# Patient Record
Sex: Male | Born: 1995
Health system: Southern US, Community
[De-identification: ages and names within clinical notes are randomized; demographics above are authoritative.]

---
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
## 2019-03-14 ENCOUNTER — Emergency Department: Payer: Self-pay

## 2019-03-14 ENCOUNTER — Other Ambulatory Visit: Payer: Self-pay

## 2019-03-14 ENCOUNTER — Emergency Department
Admission: EM | Admit: 2019-03-14 | Discharge: 2019-03-14 | Disposition: A | Discharge status: Home / Self Care | Payer: Self-pay | Attending: Student in an Organized Health Care Education/Training Program | Admitting: Student in an Organized Health Care Education/Training Program

## 2019-03-14 DIAGNOSIS — M62838 Other muscle spasm: Secondary | ICD-10-CM | POA: Insufficient documentation

## 2019-03-14 DIAGNOSIS — M25511 Pain in right shoulder: Secondary | ICD-10-CM | POA: Diagnosis present

## 2019-03-14 MED ORDER — NAPROXEN 500 MG PO TABS
500.0000 mg | ORAL_TABLET | Freq: Once | ORAL | Status: AC
  Administered 2019-03-14: 21:00:00 500 mg via ORAL
  Filled 2019-03-14: qty 1

## 2019-03-14 MED ORDER — CYCLOBENZAPRINE HCL 10 MG PO TABS
10.0000 mg | ORAL_TABLET | Freq: Once | ORAL | Status: AC
  Administered 2019-03-14: 10 mg via ORAL
  Filled 2019-03-14: qty 1

## 2019-03-14 MED ORDER — IBUPROFEN 600 MG PO TABS: 600.0000 mg | ORAL_TABLET | Freq: Four times a day (QID) | ORAL | 0 refills | Status: AC | PRN

## 2019-03-14 MED ORDER — CYCLOBENZAPRINE HCL 10 MG PO TABS: 10.0000 mg | ORAL_TABLET | Freq: Three times a day (TID) | ORAL | 0 refills | Status: AC | PRN

## 2019-03-14 NOTE — ED Triage Notes (Signed)
Pt states he sneezed around 1600 today, resulting in right shoulder pain and swelling. Pt is able to aduct and abduct without difficulty.

## 2019-03-14 NOTE — ED Notes (Signed)
ED Provider at bedside. 

## 2019-03-14 NOTE — ED Provider Notes (Signed)
[facility] Emergency Department Provider Note ____________________________________________  Time seen: Approximately 8:36 PM  I have reviewed the triage vital signs and the nursing notes.   HISTORY  Chief Complaint Shoulder Pain    HPI [name] is a 23 y.o. male who presents to the emergency department for evaluation and treatment of right shoulder pain.  Patient sneezed approximately 4 PM this afternoon and subsequently had immediate and intense right shoulder pain.  Mother states that she looked at his back and shoulder and said that it looked "swollen."  She applied icy hot.  Pain continues.  No past medical history on file.  There are no active problems to display for this patient.  Prior to Admission medications   Medication Sig Start Date End Date Taking? Authorizing Provider  cyclobenzaprine (FLEXERIL) 10 MG tablet Take 1 tablet (10 mg total) by mouth 3 (three) times daily as needed. 03/14/19   Eduardo Tesfaye B, FNP  ibuprofen (ADVIL) 600 MG tablet Take 1 tablet (600 mg total) by mouth every 6 (six) hours as needed. 03/14/19   Eduardo Pester, FNP    Allergies Patient has no known allergies.  No family history on file.  Social History Social History   Tobacco Use  . Smoking status: Not on file  Substance Use Topics  . Alcohol use: Not on file  . Drug use: Not on file    Review of Systems Constitutional: Negative for fever. Cardiovascular: Negative for chest pain. Respiratory: Negative for shortness of breath. Musculoskeletal: Positive for left shoulder pain. Skin: Negative for open wounds or lesions Neurological: Negative for decrease in sensation  ____________________________________________   PHYSICAL EXAM:  VITAL SIGNS: ED Triage Vitals [03/14/19 2011]  Enc Vitals Group     BP (!) 144/73     Pulse Rate 64     Resp 14     Temp 98.1 F (36.7 C)     Temp Source Oral     SpO2 98 %     Weight 228 lb (103.4 kg)   Height 5\' 10"  (1.778 m)     Head Circumference      Peak Flow      Pain Score 8     Pain Loc      Pain Edu?      Excl. in GC?     Constitutional: Alert and oriented. Well appearing and in no acute distress. Eyes: Conjunctivae are clear without discharge or drainage Head: Atraumatic Neck: Supple.  No focal midline tenderness. Respiratory: No cough. Respirations are even and unlabored. Musculoskeletal: Full range of motion of the right shoulder.  Superior aspect of the right shoulder is tender upon palpation.  No swelling Neurologic: Motor and sensory function is intact Skin: Skin is intact Psychiatric: Affect and behavior are appropriate.  ____________________________________________   LABS (all labs ordered are listed, but only abnormal results are displayed)  Labs Reviewed - No data to display ____________________________________________  RADIOLOGY  Images with shoulder ____________________________________________   PROCEDURES  Procedures  ____________________________________________   INITIAL IMPRESSION / ASSESSMENT AND PLAN / ED COURSE  [name] is a 23 y.o. who presents to the emergency department for treatment and evaluation of right shoulder pain after sneezing at approximately 4:00 this afternoon.  See HPI for further details.  Patient will be treated with 5 mg of Flexeril and 500 mg of Naprosyn.  Prescriptions for the same will be submitted to his pharmacy.  Patient instructed to follow-up with orthopedics for symptoms that are not  improving with time, rest and medication.  He was also instructed to return to the emergency department for symptoms that change or worsen if unable schedule an appointment with orthopedics or primary care.  Medications  cyclobenzaprine (FLEXERIL) tablet 10 mg (10 mg Oral Given 03/14/19 2113)  naproxen (NAPROSYN) tablet 500 mg (500 mg Oral Given 03/14/19 2114)    Pertinent labs & imaging results that were available during my  care of the patient were reviewed by me and considered in my medical decision making (see chart for details).  _________________________________________   FINAL CLINICAL IMPRESSION(S) / ED DIAGNOSES  Final diagnoses:  Trapezius muscle spasm    ED Discharge Orders         Ordered    cyclobenzaprine (FLEXERIL) 10 MG tablet  3 times daily PRN     03/14/19 2105    ibuprofen (ADVIL) 600 MG tablet  Every 6 hours PRN     03/14/19 2105           If controlled substance prescribed during this visit, 12 month history viewed on the [facility] prior to issuing an initial prescription for Schedule II or III opiod.   Eduardo Dike, FNP 03/14/19 2154    Eduardo Lot, MD 03/14/19 2202

## 2019-08-27 ENCOUNTER — Ambulatory Visit: Payer: Self-pay

## 2020-10-03 IMAGING — CR DG SHOULDER 2+V*R*
3 series · 3 of 3 positions shown · non-contrast
Comparison: None.

CLINICAL DATA: Pain

EXAM:
RIGHT SHOULDER - 2+ VIEW

[shoulder grashey]
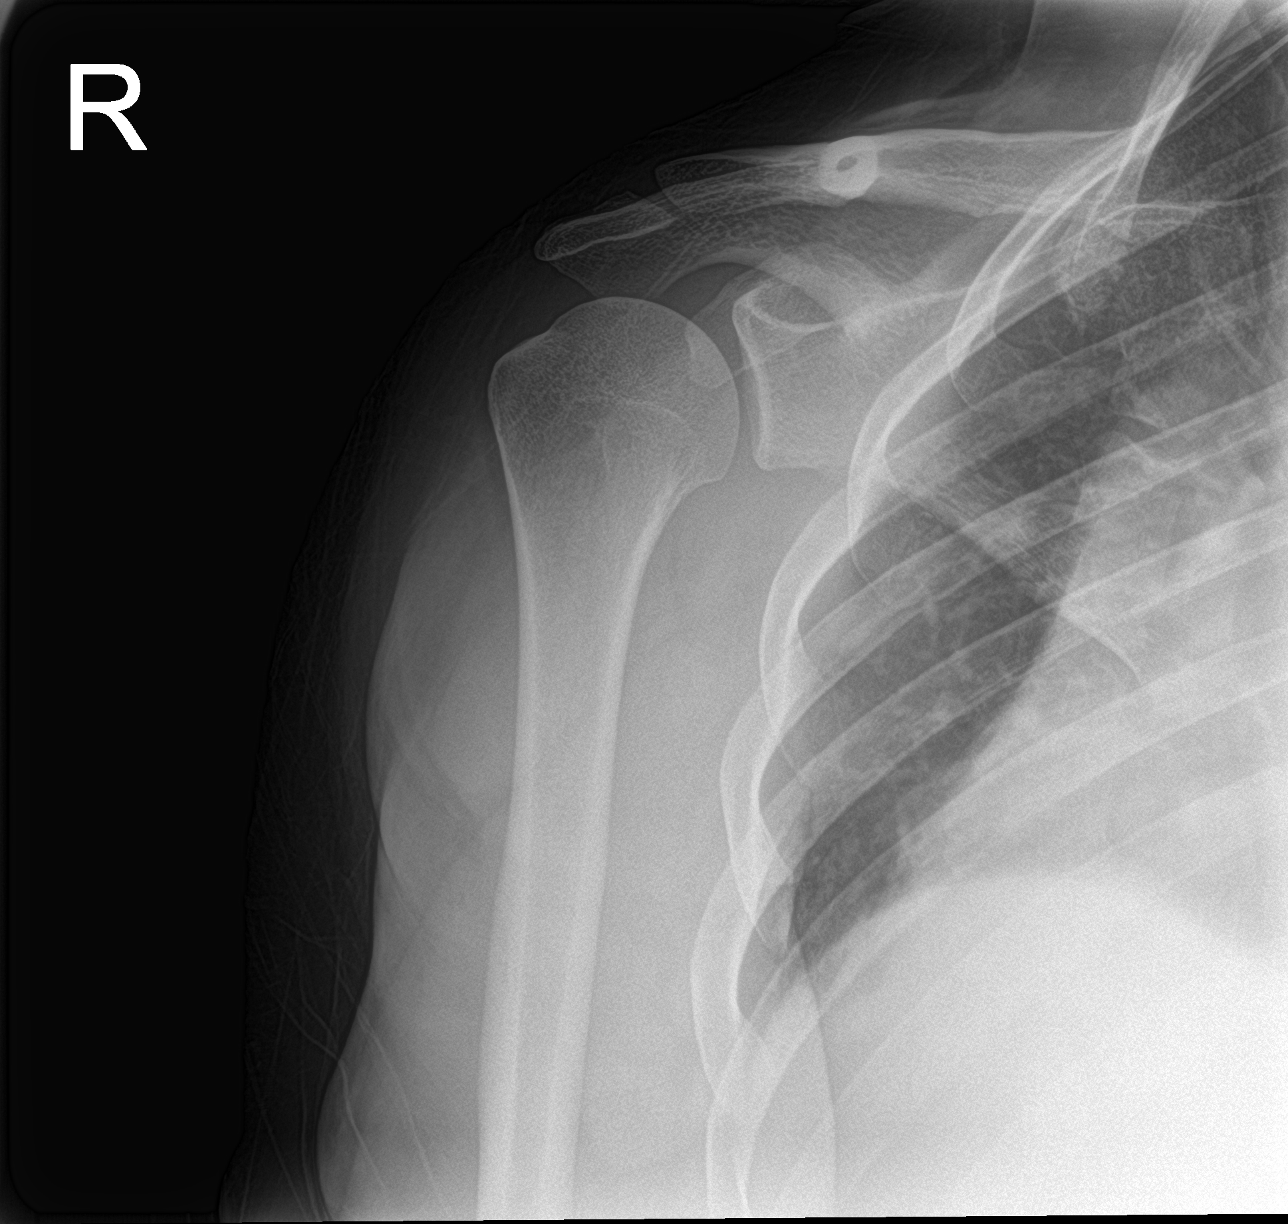

[shoulder y view]
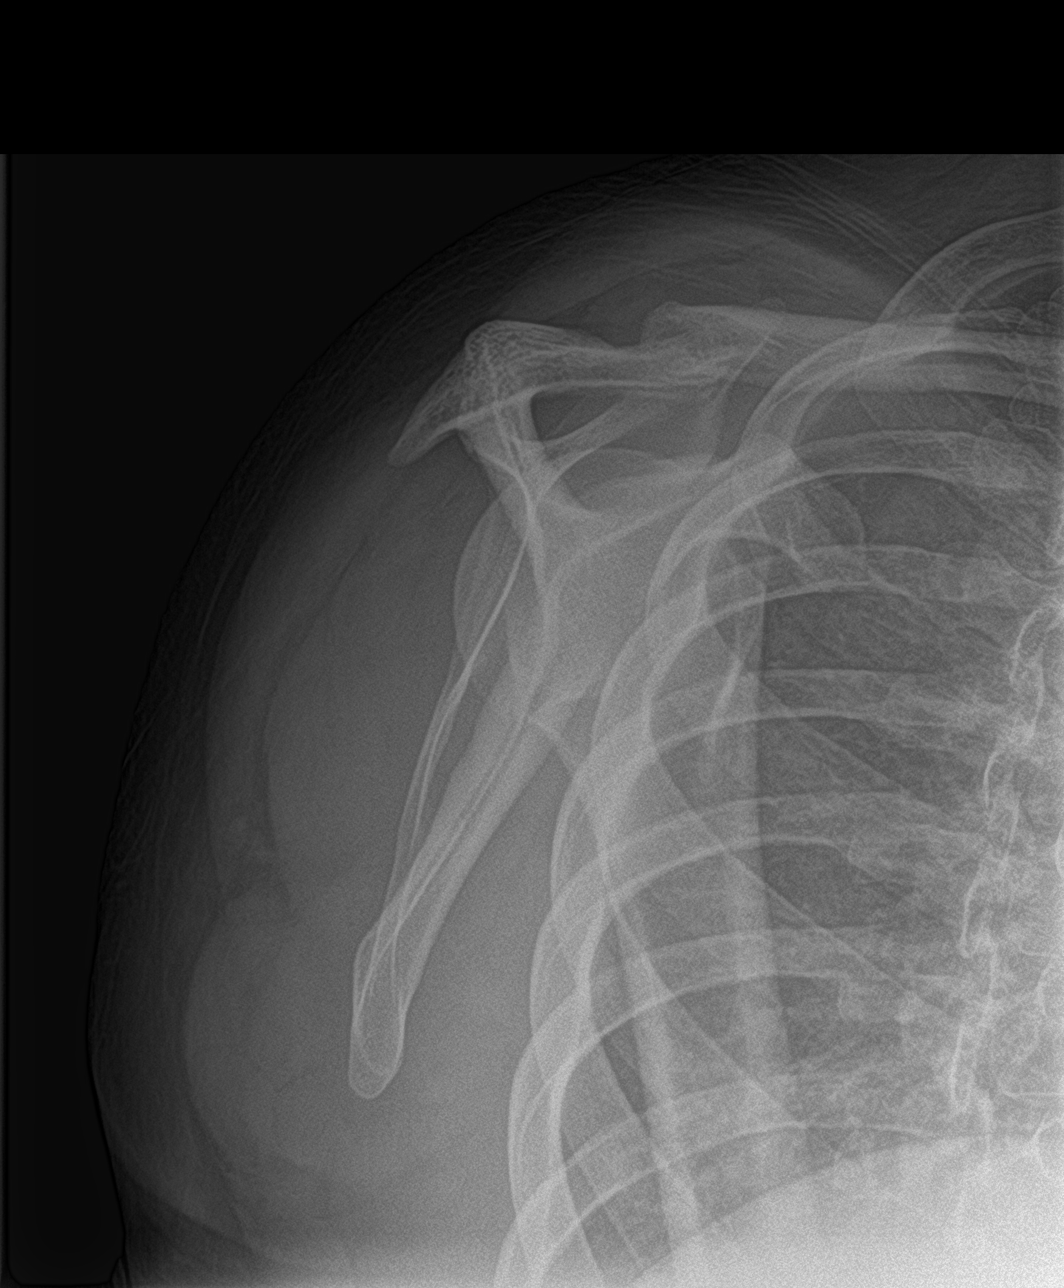

[shoulder ap neutral]
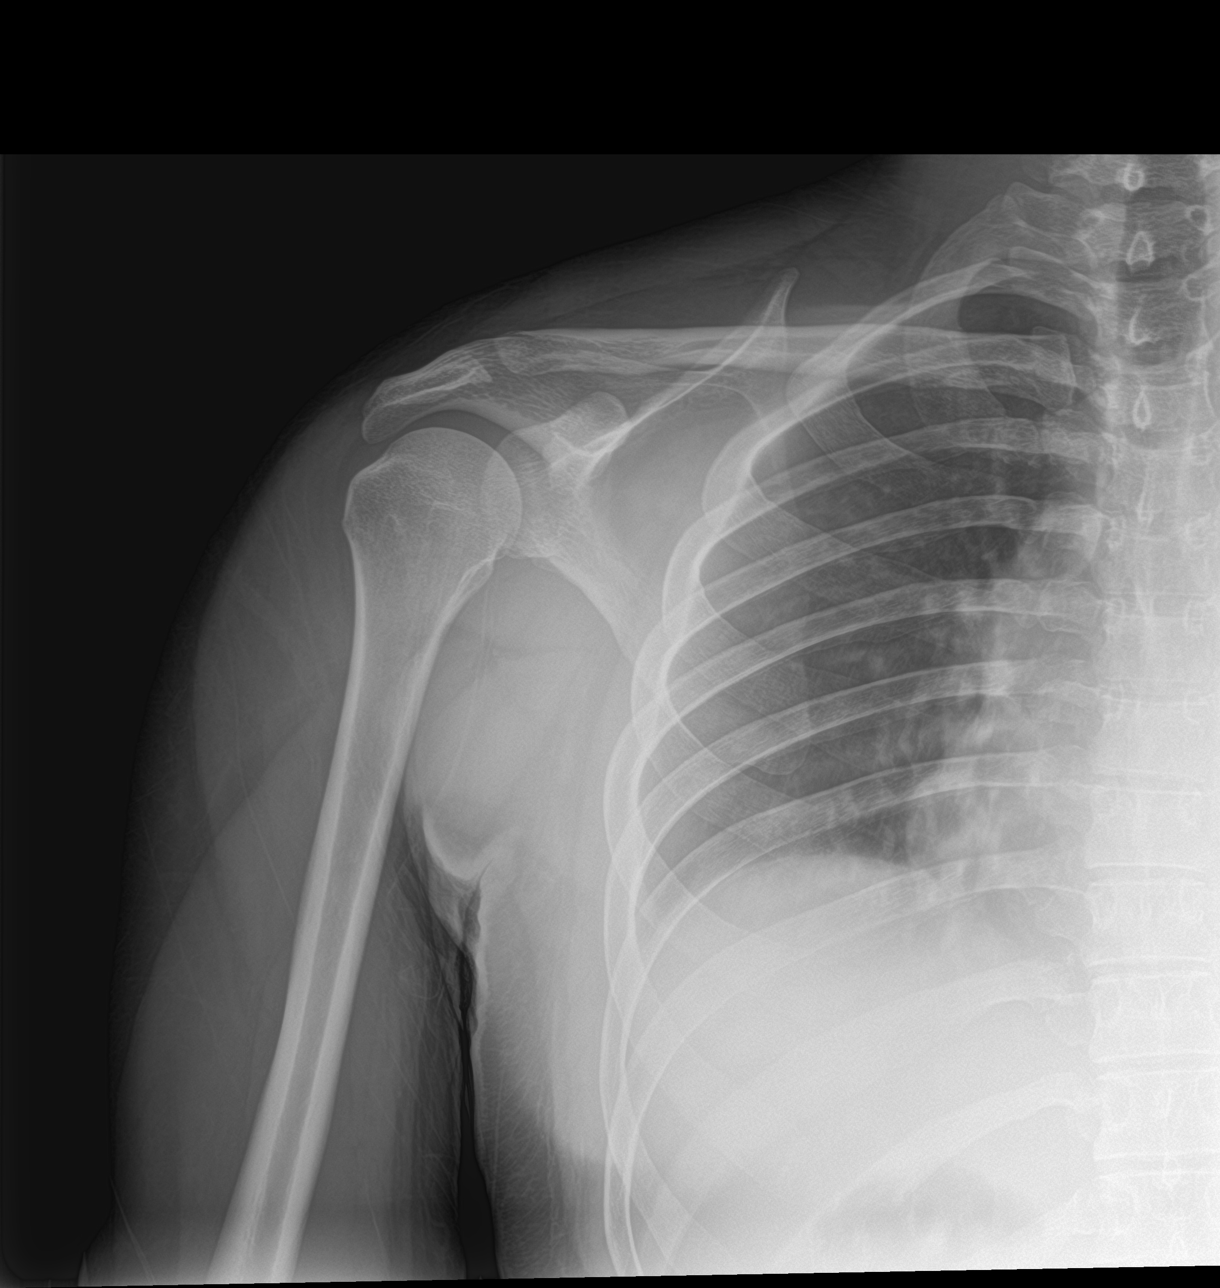

[3 of 3 positions shown; findings below may reference images not displayed]

FINDINGS: There is no evidence of fracture or dislocation. There is no
evidence of arthropathy or other focal bone abnormality. Soft
tissues are unremarkable.
IMPRESSION: Negative.

## 2022-07-16 ENCOUNTER — Encounter: Payer: Worker's Compensation | Attending: Physician Assistant | Admitting: Physician Assistant

## 2022-07-16 DIAGNOSIS — T25322A Burn of third degree of left foot, initial encounter: Secondary | ICD-10-CM | POA: Diagnosis present

## 2022-07-16 DIAGNOSIS — W3182XA Contact with other commercial machinery, initial encounter: Secondary | ICD-10-CM | POA: Diagnosis not present

## 2022-07-16 DIAGNOSIS — L97522 Non-pressure chronic ulcer of other part of left foot with fat layer exposed: Secondary | ICD-10-CM | POA: Diagnosis not present

## 2022-07-18 NOTE — Progress Notes (Signed)
[name] ([id number]) [phone] [id number] Page 1 of 4 Visit Report for 07/16/2022 Abuse Risk Screen Details Patient Name: Date of Service: [name] 07/16/2022 9:30 A M Medical Record Number: [id number] Patient Account Number: [id number] Date of Birth/Sex: Treating RN: Jul 24, 1995 (27 y.o. [name]) [name] Primary Care Aamirah Salmi: PA [name], NO Other Clinician: Referring Dartagnan Beavers: Treating Adolphe Fortunato/Extender: [name] in Treatment: 0 Abuse Risk Screen Items Answer ABUSE RISK SCREEN: Has anyone close to you tried to hurt or harm you recentlyo No Do you feel uncomfortable with anyone in your familyo No Has anyone forced you do things that you didnt want to doo No Electronic Signature(s) Signed: 07/18/2022 4:28:11 PM By: [name] RN Entered By: [name] on 07/16/2022 10:12:38 -------------------------------------------------------------------------------- Activities of Daily Living Details Patient Name: Date of Service: DASH, THORELL 07/16/2022 9:30 A M Medical Record Number: [id number] Patient Account Number: [id number] Date of Birth/Sex: Treating RN: 1996-03-27 (27 y.o. [name]) [name] Primary Care Lyne Khurana: PA [name], NO Other Clinician: Referring Ellenor Wisniewski: Treating Taelor Moncada/Extender: [name] in Treatment: 0 Activities of Daily Living Items Answer Activities of Daily Living (Please select one for each item) Drive Automobile Completely Able T Medications ake Completely Able Use T elephone Completely Able Care for Appearance Completely Able Use T oilet Completely Able Bath / Shower Completely Able Dress Self Completely Able Feed Self Completely Able Walk Completely Able Get In / Out Bed Completely Able Housework Completely Able Prepare Meals Completely Able Handle Money Completely Able Shop for Self Completely Able Electronic Signature(s) Signed: 07/18/2022 4:28:11 PM By: [name]  RN Entered By: [name] on 07/16/2022 10:13:00 -------------------------------------------------------------------------------- Education Screening Details Patient Name: Date of Service: [name] NU 07/16/2022 9:30 A M Medical Record Number: [id number] Patient Account Number: [id number] Date of Birth/Sex: Treating RN: 1995-05-21 (27 y.o. [name] Primary Care Adelin Ventrella: PA [name], NO Other Clinician: Referring Anijah Spohr: Treating Alanna Storti/Extender: [name] in Treatment: 0 [name] ([id number]) [phone] [id number] Page 2 of 4 Primary Learner Assessed: Patient Learning Preferences/Education Level/Primary Language Learning Preference: Explanation Highest Education Level: College or Above Preferred Language: English Cognitive Barrier Language Barrier: No Translator Needed: No Memory Deficit: No Emotional Barrier: No Cultural/Religious Beliefs Affecting Medical Care: No Physical Barrier Impaired Vision: No Impaired Hearing: No Decreased Hand dexterity: No Knowledge/Comprehension Knowledge Level: Medium Comprehension Level: High Ability to understand written instructions: High Ability to understand verbal instructions: High Motivation Anxiety Level: Anxious Cooperation: Cooperative Education Importance: Acknowledges Need Interest in Health Problems: Asks Questions Perception: Coherent Willingness to Engage in Self-Management High Activities: Readiness to Engage in Self-Management High Activities: Electronic Signature(s) Signed: 07/18/2022 4:28:11 PM By: [name] RN Entered By: [name] on 07/16/2022 10:13:30 -------------------------------------------------------------------------------- Fall Risk Assessment Details Patient Name: Date of Service: [name] NU 07/16/2022 9:30 A M Medical Record Number: [id number] Patient Account Number: [id number] Date of Birth/Sex: Treating RN: [birth year] (27 y.o. [name])  [name] Primary Care Finnigan Warriner: PA [name], NO Other Clinician: Referring Latonia Conrow: Treating Darleen Moffitt/Extender: [name] in Treatment: 0 Fall Risk Assessment Items Have you had 2 or more falls in the last 12 monthso 0 No Have you had any fall that resulted in injury in the last 12 monthso 0 No FALLS RISK SCREEN History of falling - immediate or within 3 months 0 No Secondary diagnosis (Do you have 2 or more medical diagnoseso) 0 No Ambulatory aid None/bed rest/wheelchair/nurse 0 No Crutches/cane/walker 0 No Furniture 0 No Intravenous therapy Access/Saline/Heparin [employer]  0 No Gait/Transferring Normal/ bed rest/ wheelchair 0 No Weak (short steps with or without shuffle, stooped but able to lift head while walking, may seek 0 No support from furniture) Impaired (short steps with shuffle, may have difficulty arising from chair, head down, impaired 0 No balance) Mental Status Oriented to own ability 0 No Overestimates or forgets limitations 0 No Risk Level: Low Risk Score: 0 [name] ([id number]) [id number] Page 3 of 4 Electronic Signature(s) -------------------------------------------------------------------------------- Foot Assessment Details Patient Name: Date of Service: [name] 07/16/2022 9:30 A M Medical Record Number: [id number] Patient Account Number: [id number] Date of Birth/Sex: Treating RN: 1995/09/20 (27 y.o. [name]) [name] Primary Care Dynastee Brummell: PA [name], NO Other Clinician: Referring Noemy Hallmon: Treating Nandan Willems/Extender: [name] in Treatment: 0 Foot Assessment Items Site Locations + = Sensation present, - = Sensation absent, C = Callus, U = Ulcer R = Redness, W = Warmth, M = Maceration, PU = Pre-ulcerative lesion F = Fissure, S = Swelling, D = Dryness Assessment Right: Left: Other Deformity: No No Prior Foot Ulcer: No No Prior Amputation: No No Charcot Joint: No  No Ambulatory Status: Ambulatory Without Help Gait: Steady Electronic Signature(s) Signed: 07/18/2022 4:28:11 PM By: [name] RN Entered By: [name] on 07/16/2022 10:21:04 -------------------------------------------------------------------------------- Nutrition Risk Screening Details Patient Name: Date of Service: HANNAH, [place] 07/16/2022 9:30 A M Medical Record Number: [id number] Patient Account Number: [id number] Date of Birth/Sex: Treating RN: 02-13-1996 (27 y.o. [name] Primary Care Braxton Vantrease: PA [name], NO Other Clinician: Referring Fonnie Crookshanks: Treating Delona Clasby/Extender: [name] in Treatment: 0 Height (in): 69 Weight (lbs): 250 Body Mass Index (BMI): 36.9 Fontes, Darrion ([id number]) [id number] Page 4 of 4 Nutrition Risk Screening Items Score Screening NUTRITION RISK SCREEN: I have an illness or condition that made me change the kind and/or amount of food I eat 0 No I eat fewer than two meals per day 0 No I eat few fruits and vegetables, or milk products 0 No I have three or more drinks of beer, liquor or wine almost every day 0 No I have tooth or mouth problems that make it hard for me to eat 0 No I don't always have enough money to buy the food I need 0 No I eat alone most of the time 0 No I take three or more different prescribed or over-the-counter drugs a day 0 No Without wanting to, I have lost or gained 10 pounds in the last six months 0 No I am not always physically able to shop, cook and/or feed myself 0 No Nutrition Protocols Good Risk Protocol 0 No interventions needed Moderate Risk Protocol High Risk Proctocol Risk Level: Good Risk Score: 0 Electronic Signature(s) Signed: 07/18/2022 4:28:11 PM By: [name] RN Entered By: [name] on 07/16/2022 10:13:53

## 2022-07-18 NOTE — Progress Notes (Signed)
[name] ([id number]) [id number] Page 1 of 7 Visit Report for 07/16/2022 Chief Complaint Document Details Patient Name: Date of Service: [name], [name] 07/16/2022 9:30 A M Medical Record Number: [id number] Patient Account Number: [id number] Date of Birth/Sex: Treating RN: July 14, 1995 (27 y.o. [name]) [name] Primary Care Provider: PA [name], NO Other Clinician: Referring Provider: Treating Provider/Extender: [name] in Treatment: 0 Information Obtained from: Patient Chief Complaint Left foot thermal burn Electronic Signature(s) Signed: 07/16/2022 10:56:13 AM By: Lenda Kelp PA-C Entered By: Lenda Kelp on 07/16/2022 10:56:13 -------------------------------------------------------------------------------- HPI Details Patient Name: Date of Service: [name] 07/16/2022 9:30 A M Medical Record Number: [id number] Patient Account Number: [id number] Date of Birth/Sex: Treating RN: [birth year] (27 y.o. [name] Primary Care Provider: PA [name], NO Other Clinician: Referring Provider: Treating Provider/Extender: [name] in Treatment: 0 History of Present Illness HPI Description: 07-16-2022 upon evaluation today patient presents for evaluation in the clinic as a result of an injury that occurred on 06-09-2022. He tells me that he actually works on an [employer] when he slipped and his foot actually got caught in a contraction/machine that caused what appears to be a significant friction burn to the dorsal surface of his foot. Initially they were not sure that he had not broken anything and actually he was sent to [facility] in order to get this checked out. Subsequently ended up not have anything broken which is great news and I am pleased in that regard. With that being said unfortunately he did have issues here with what appears to be a significant burn and while the majority this was more  of a stage II I think the worst part of this is a stage III friction burn which again is thermal in nature to the top of his foot. There is slough and biofilm buildup. This is getting to be cleared away. The good news is the patient really does not have any major medical problems. He is in pretty good shape otherwise which is great news. Electronic Signature(s) Signed: 07/16/2022 5:50:41 PM By: [name] PA-C Entered By: [name] on 07/16/2022 17:50:41 -------------------------------------------------------------------------------- Burn Debridement: Small Details Patient Name: Date of Service: [name], [name] 07/16/2022 9:30 A M Medical Record Number: [id number] Patient Account Number: [id number] Date of Birth/Sex: Treating RN: 1996/04/08 (27 y.o. [name] Primary Care Provider: PA [name], NO Other Clinician: Referring Provider: Treating Provider/Extender: [name] in Treatment: 0 Procedure Performed for: Wound #1 Left,Lateral Foot Performed By: Physician Nelida Meuse., PA-C Post Procedure Diagnosis Same as Pre-procedure Notes Debridement Performed for Assessment: Wound #1 Left,Lateral Foot [name] ([id number]) [id number] Page 2 of 7 Performed by: Physician Clinician [name] Debridement Type: Debridement Level of Consciousness pre-procedure: Awake and Alert Pre-procedure Verification/Time-Out Taken: Yes 10:45 Start Time: 10:45 Pain Control: Area based upon Length x Width = 2.1 (cm) Area Debrided: Length: (cm) 1.4 Width: (cm) 1.5 T Surface Area Debrided: (cm) otal 2.1 Tissue and other material debrided: Viable Non-Viable Biofilm Blood Clots Bone Callus Cartilage Eschar Fascia Fat Fibrin/Exudate Hyper-granulation Joint Capsule Ligament Muscle Subcutaneous Skin: Dermis Skin: Epidermis Slough T endon Other Level: Skin/Subcutaneous Tissue Debridement Description: Excisional Instrument: Blade Curette  Electrocautery Forceps Nippers Rongeur Scissors Other Specimen: Swab Tissue Culture None Bleeding: Minimum Hemostasis Achieved: Pressure End Time: 10:50 Procedural Pain: 2 Post Procedural Pain: 0 Response to Treatment: Procedure was tolerated well Level of Consciousness post-procedure: Awake and Alert Open Post Debridement Measurements  of T Wound otal Length: (cm) 1.4 Width: (cm) 1.5 Depth: (cm) 0.3 Volume: (cm) 0.495 Character of Wound/Ulcer Post Debridement: Improved Requires Further Debridement Stable Reference values from 07/16/2022 Wound Assessment Length: (cm) 1.4 Width: (cm) 1.5 Depth: (cm) 0.3 Area:(cm) 1.649 Volume:(cm) 0.495 Import Existing Debridement Details Import No Thanks Post Procedure Diagnosis Same as Pre Procedure Post Procedure Diagnosis - not same as Pre-procedure Close Notes Electronic Signature(s) Signed: 07/16/2022 11:17:00 AM By: [name] RN Entered By: [name] on 07/16/2022 11:17:00 [name] ([id number]) [id number] Page 3 of 7 -------------------------------------------------------------------------------- Physical Exam Details Patient Name: Date of Service: [name], [name] 07/16/2022 9:30 A M Medical Record Number: [id number] Patient Account Number: [id number] Date of Birth/Sex: Treating RN: 02-04-1996 (27 y.o. [name] Primary Care Provider: PA [name], NO Other Clinician: Referring Provider: Treating Provider/Extender: [name] in Treatment: 0 Constitutional sitting or standing blood pressure is within target range for patient.. pulse regular and within target range for patient.Marland Kitchen respirations regular, non-labored and within target range for patient.Marland Kitchen temperature within target range for patient.. Well-nourished and well-hydrated in no acute distress. Eyes conjunctiva clear no eyelid edema noted. pupils equal round and reactive to light and accommodation. Ears,  Nose, Mouth, and Throat no gross abnormality of ear auricles or external auditory canals. normal hearing noted during conversation. mucus membranes moist. Respiratory normal breathing without difficulty. Cardiovascular 2+ dorsalis pedis/posterior tibialis pulses. no clubbing, cyanosis, significant edema, <3 sec cap refill. Musculoskeletal normal gait and posture. no significant deformity or arthritic changes, no loss or range of motion, no clubbing. Psychiatric this patient is able to make decisions and demonstrates good insight into disease process. Alert and Oriented x 3. pleasant and cooperative. Notes Upon inspection patient's wound bed did require sharp debridement clearway necrotic debris this included slough and biofilm down to good subcutaneous tissue. He tolerated this today without complication and postdebridement the wound bed is significantly improved which is great news. Electronic Signature(s) Signed: 07/16/2022 5:51:13 PM By: [name] PA-C Entered By: [name] on 07/16/2022 17:51:12 -------------------------------------------------------------------------------- Physician Orders Details Patient Name: Date of Service: [name], [name] 07/16/2022 9:30 A M Medical Record Number: [id number] Patient Account Number: [id number] Date of Birth/Sex: Treating RN: 10/16/1995 (27 y.o. [name] Primary Care Provider: PA [name], NO Other Clinician: Referring Provider: Treating Provider/Extender: [name] in Treatment: 0 Verbal / Phone Orders: No Diagnosis Coding Follow-up Appointments Return Appointment in 1 week. Bathing/ Shower/ Hygiene May shower; gently cleanse wound with antibacterial soap, rinse and pat dry prior to dressing wounds Anesthetic (Use 'Patient Medications' Section for Anesthetic Order Entry) Lidocaine applied to wound bed Wound Treatment Wound #1 - Foot Wound Laterality: Left, Lateral Cleanser: Soap and Water 3 x Per Week/30  Days Discharge Instructions: Gently cleanse wound with antibacterial soap, rinse and pat dry prior to dressing wounds Prim Dressing: Prisma 4.34 (in) 3 x Per Week/30 Days ary Discharge Instructions: Moisten w/normal saline or sterile water; Cover wound as directed. Do not remove from wound bed. Secondary Dressing: Coverlet Latex-Free Fabric Adhesive Dressings 3 x Per Week/30 Days [name], [name] ([id number]) [id number] Page 4 of 7 Discharge Instructions: 1.5 x 2 Electronic Signature(s) Signed: 07/16/2022 6:21:14 PM By: [name] PA-C Signed: 07/18/2022 4:28:11 PM By: [name] RN Entered By: [name] on 07/16/2022 10:51:45 -------------------------------------------------------------------------------- Problem List Details Patient Name: Date of Service: [name], [name] 07/16/2022 9:30 A M Medical Record Number: [id number] Patient Account Number: [id number] Date of Birth/Sex: Treating RN: 21-Jan-1996 (27 y.o.  [name] Primary Care Provider: PA [name], [place] Other Clinician: Referring Provider: Treating Provider/Extender: [name] in Treatment: 0 Active Problems ICD-10 Encounter Code Description Active Date MDM Diagnosis T25.322A Burn of third degree of left foot, initial encounter 07/16/2022 No Yes L97.522 Non-pressure chronic ulcer of other part of left foot with fat layer exposed 07/16/2022 No Yes Inactive Problems Resolved Problems Electronic Signature(s) Signed: 07/16/2022 10:55:59 AM By: Lenda Kelp PA-C Entered By: Lenda Kelp on 07/16/2022 10:55:58 -------------------------------------------------------------------------------- Progress Note Details Patient Name: Date of Service: [name], [name] 07/16/2022 9:30 A M Medical Record Number: [id number] Patient Account Number: [id number] Date of Birth/Sex: Treating RN: 1995-06-08 (27 y.o. [name] Primary Care Provider: PA [name], NO Other Clinician: Referring  Provider: Treating Provider/Extender: [name] in Treatment: 0 Subjective Chief Complaint Information obtained from Patient Left foot thermal burn History of Present Illness (HPI) 07-16-2022 upon evaluation today patient presents for evaluation in the clinic as a result of an injury that occurred on 06-09-2022. He tells me that he actually works on an [employer] when he slipped and his foot actually got caught in a contraction/machine that caused what appears to be a significant friction burn to the dorsal surface of his foot. Initially they were not sure that he had not broken anything and actually he was sent to [facility] in order to get this checked out. Subsequently ended up not have anything broken which is great news and I am pleased in that regard. With that being said unfortunately he did have issues here with what appears to be a significant burn and while the majority this was more of a stage II I think the worst part of this is a stage III friction burn which again is thermal in nature to the top of his foot. There is slough and biofilm buildup. This is getting to be cleared away. The good news is the patient really does not have any major medical problems. He is in pretty good shape otherwise which is great news. Patient History Allergies No Known Allergies [name], [name] ([id number]) [id number] Page 5 of 7 Social History Current every day smoker, Marital Status - Single, Alcohol Use - Never, Drug Use - No History, Caffeine Use - Moderate. Review of Systems (ROS) Integumentary (Skin) Complains or has symptoms of Wounds, Swelling. Objective Constitutional sitting or standing blood pressure is within target range for patient.. pulse regular and within target range for patient.Marland Kitchen respirations regular, non-labored and within target range for patient.Marland Kitchen temperature within target range for patient.. Well-nourished and  well-hydrated in no acute distress. Vitals Time Taken: 10:11 AM, Height: 69 in, Source: Stated, Weight: 250 lbs, Source: Stated, BMI: 36.9, Temperature: 98.1 F, Pulse: 79 bpm, Respiratory Rate: 18 breaths/min, Blood Pressure: 135/89 mmHg. Eyes conjunctiva clear no eyelid edema noted. pupils equal round and reactive to light and accommodation. Ears, Nose, Mouth, and Throat no gross abnormality of ear auricles or external auditory canals. normal hearing noted during conversation. mucus membranes moist. Respiratory normal breathing without difficulty. Cardiovascular 2+ dorsalis pedis/posterior tibialis pulses. no clubbing, cyanosis, significant edema, Musculoskeletal normal gait and posture. no significant deformity or arthritic changes, no loss or range of motion, no clubbing. Psychiatric this patient is able to make decisions and demonstrates good insight into disease process. Alert and Oriented x 3. pleasant and cooperative. General Notes: Upon inspection patient's wound bed did require sharp debridement clearway necrotic debris this included slough and biofilm down to good  subcutaneous tissue. He tolerated this today without complication and postdebridement the wound bed is significantly improved which is great news. Integumentary (Hair, Skin) Wound #1 status is Open. Original cause of wound was Trauma. The date acquired was: 06/09/2022. The wound is located on the Left,Lateral Foot. The wound measures 1.4cm length x 1.5cm width x 0.3cm depth; 1.649cm^2 area and 0.495cm^3 volume. There is Fat Layer (Subcutaneous Tissue) exposed. There is no tunneling or undermining noted. There is a medium amount of serosanguineous drainage noted. There is medium (34-66%) red, pink granulation within the wound bed. There is a medium (34-66%) amount of necrotic tissue within the wound bed including Adherent Slough. Assessment Active Problems ICD-10 Burn of third degree of left foot, initial  encounter Non-pressure chronic ulcer of other part of left foot with fat layer exposed Procedures Wound #1 Pre-procedure diagnosis of Wound #1 is a 3rd degree Burn located on the Left,Lateral Foot . An Burn Debridement: Small procedure was performed by Nelida MeuseStone, Brandilee Pies E., PA-C. Post procedure Diagnosis Wound #1: Same as Pre-Procedure Notes: Debridement Performed for Assessment: Wound #1 Left,Lateral Foot Performed by: Physician Clinician Allen DerryStone, Kaiea Esselman Debridement Type: Debridement Level of Consciousness pre-procedure: Awake and Alert Pre-procedure Verification/Time-Out Taken: Yes 10:45 Start Time: 10:45 Pain Control: Area based upon Length x Width = 2.1 (cm) Area Debrided: Length: (cm) 1.4 Width: (cm) 1.5 T Surface Area Debrided: (cm) 2.1 Tissue and other material debrided: Viable otal Non-Viable Biofilm Blood Clots Bone Callus Cartilage Eschar Fascia Fat Fibrin/Exudate Hyper-granulation Joint Capsule Ligament Muscle Subcutaneous Skin: Dermis Skin: Epidermis Slough T endon Other Level: Skin/Subcutaneous Tissue Debridement Description: Excisional Instrument: Blade Curette Electrocautery Forceps Nippers Rongeur Scissors Other Specimen: Swab Tissue Culture None Bleeding: Minimum Hemostasis Achieved: Pressure End Time: 10:50 Procedural Pain: 2 Post Procedural Pain: 0 Response to Treatment: Procedure was tolerated well Level of Consciousness post-procedure: Awake and Alert Open Post Debridement Measurements of T Wound Length: (cm) 1.4 Width: (cm) 1.5 Depth: (cm) 0.3 Volume: (cm) 0.495 Character of Wound/Ulcer Post otal Debridement: Improved Requires Further Debridement Stable Reference values from 07/16/2022 Wound Assessment Length: (cm) 1.4 Width: (cm) 1.5 Depth: (cm) 0.3 Area:(cm) 1.649 Volume:(cm) 0.495 oImport Existing Debridement Details Import No Thanks Post Procedure Diagnosis Same as Pre Procedure Post Procedure Diagnosis - not same as Pre-procedure Close Notes [name], Eduardo DawleyKEANU ([id number])  [id number] Page 6 of 7 Plan Follow-up Appointments: Return Appointment in 1 week. Bathing/ Shower/ Hygiene: May shower; gently cleanse wound with antibacterial soap, rinse and pat dry prior to dressing wounds Anesthetic (Use 'Patient Medications' Section for Anesthetic Order Entry): Lidocaine applied to wound bed WOUND #1: - Foot Wound Laterality: Left, Lateral Cleanser: Soap and Water 3 x Per Week/30 Days Discharge Instructions: Gently cleanse wound with antibacterial soap, rinse and pat dry prior to dressing wounds Prim Dressing: Prisma 4.34 (in) 3 x Per Week/30 Days ary Discharge Instructions: Moisten w/normal saline or sterile water; Cover wound as directed. Do not remove from wound bed. Secondary Dressing: Coverlet Latex-Free Fabric Adhesive Dressings 3 x Per Week/30 Days Discharge Instructions: 1.5 x 2 1. I would recommend that we have the patient continue to monitor for any evidence of infection or worsening. Obviously if anything changes he knows that he can contact the office and let me know. 2. I am good recommend as well that with this burn we go ahead and initiate treatment with the silver collagen dressing which I think is going to be a good option here for him. 3. I am also can recommend that we just use  a coverlet to cover. We will see patient back for reevaluation in 1 week here in the clinic. If anything worsens or changes patient will contact our office for additional recommendations. Electronic Signature(s) Signed: 07/16/2022 5:53:15 PM By: [name] PA-C Entered By: [name] on 07/16/2022 17:53:14 -------------------------------------------------------------------------------- ROS/PFSH Details Patient Name: Date of Service: [name], [name] 07/16/2022 9:30 A M Medical Record Number: [id number] Patient Account Number: [id number] Date of Birth/Sex: Treating RN: Sep 07, 1995 (27 y.o. [name]) [name] Primary Care Provider: PA [name], NO Other  Clinician: Referring Provider: Treating Provider/Extender: [name] in Treatment: 0 Integumentary (Skin) Complaints and Symptoms: Positive for: Wounds; Swelling Immunizations Pneumococcal Vaccine: Received Pneumococcal Vaccination: No Implantable Devices None Family and Social History Current every day smoker; Marital Status - Single; Alcohol Use: Never; Drug Use: No History; Caffeine Use: Moderate Electronic Signature(s) Signed: 07/16/2022 6:21:14 PM By: [name] PA-C Signed: 07/18/2022 4:28:11 PM By: [name] RN Entered By: [name] on 07/16/2022 10:12:32 -------------------------------------------------------------------------------- SuperBill Details Patient Name: Date of Service: [name], [name] 07/16/2022 Medical Record Number: [id number] Patient Account Number: [id number] Date of Birth/Sex: Treating RN: May 26, 1995 (27 y.o. Lina Sar, [facility] Primary Care Provider: [name] Other ClinicianVena Bell ([id number]) [id number] Page 7 of 7 Referring Provider: Treating Provider/Extender: [name] in Treatment: 0 Diagnosis Coding ICD-10 Codes Code Description T25.322A Burn of third degree of left foot, initial encounter L97.522 Non-pressure chronic ulcer of other part of left foot with fat layer exposed Facility Procedures : CPT4 Code: [id number] Description: 99213 - WOUND CARE VISIT-LEV 3 EST PT Modifier: Quantity: 1 : CPT4 Code: 71959747 Description: 16020 - BURN DRSG W/O ANESTH-SM ICD-10 Diagnosis Description T25.322A Burn of third degree of left foot, initial encounter Modifier: Quantity: 1 Physician Procedures : CPT4 Code Description Modifier [id number] WC PHYS LEVEL 3 NEW PT 25 ICD-10 Diagnosis Description T25.322A Burn of third degree of left foot, initial encounter L97.522 Non-pressure chronic ulcer of other part of left foot with fat layer exposed Quantity: 1 : [id number] 16020 -  WC PHYS DRESS/DEBRID SM,<5% TOT BODY SURF ICD-10 Diagnosis Description T25.322A Burn of third degree of left foot, initial encounter Quantity: 1 Electronic Signature(s) Signed: 07/16/2022 5:53:41 PM By: [name] PA-C Entered By: [name] on 07/16/2022 17:53:41

## 2022-07-18 NOTE — Progress Notes (Signed)
[name] ([id number]) [id number] Page 1 of 6 Visit Report for 07/16/2022 Allergy List Details Patient Name: Date of Service: [name], [name] 07/16/2022 9:30 A M Medical Record Number: [id number] Patient Account Number: [id number] Date of Birth/Sex: Treating Bell: 1996/04/05 (27 y.o. [name]) [name] Primary Care [name]: PA [name], NO Other Clinician: Referring [name]: Treating [name]/Extender: [name] in Treatment: 0 Allergies Active Allergies No Known Allergies Allergy Notes Electronic Signature(s) Signed: 07/18/2022 4:28:11 PM By: Eduardo Pax Bell Entered By: [name] on 07/16/2022 10:11:40 -------------------------------------------------------------------------------- Arrival Information Details Patient Name: Date of Service: [name] Bell 07/16/2022 9:30 A M Medical Record Number: [id number] Patient Account Number: [id number] Date of Birth/Sex: Treating Bell: 16-Jun-1995 (27 y.o. [name]) [name] Primary Care [name]: PA [name], NO Other Clinician: Referring [name]: Treating [name]/Extender: [name] in Treatment: 0 Visit Information Patient Arrived: Ambulatory Arrival Time: 10:08 Accompanied By: self Transfer Assistance: None Patient Identification Verified: Yes Secondary Verification Process Completed: Yes Patient Requires Transmission-Based Precautions: No Patient Has Alerts: No Electronic Signature(s) Signed: 07/18/2022 4:28:11 PM By: Eduardo Pax Bell Entered By: [name] on 07/16/2022 10:09:51 -------------------------------------------------------------------------------- Clinic Level of Care Assessment Details Patient Name: Date of Service: [name] 07/16/2022 9:30 A M Medical Record Number: [id number] Patient Account Number: [id number] Date of Birth/Sex: Treating Bell: [birth year] (27 y.o. [name] Primary Care [name]: PA [name], NO Other Clinician: Referring  [name]: Treating [name]/Extender: [name] in Treatment: 0 Clinic Level of Care Assessment Items TOOL 1 Quantity Score X- 1 0 Use when EandM and Procedure is performed on INITIAL visit ASSESSMENTS - Nursing Assessment / Reassessment X- 1 20 General Physical Exam (combine w/ comprehensive assessment (listed just below) when performed on new pt. evals) X- 1 25 Comprehensive Assessment (HX, ROS, Risk Assessments, Wounds Hx, etc.) [name] ([id number]) [id number] Page 2 of 6 ASSESSMENTS - Wound and Skin Assessment / Reassessment []  - 0 Dermatologic / Skin Assessment (not related to wound area) ASSESSMENTS - Ostomy and/or Continence Assessment and Care []  - 0 Incontinence Assessment and Management []  - 0 Ostomy Care Assessment and Management (repouching, etc.) PROCESS - Coordination of Care X - Simple Patient / Family Education for ongoing care 1 15 []  - 0 Complex (extensive) Patient / Family Education for ongoing care X- 1 10 Staff obtains Chiropractor, Records, T Results / Process Orders est []  - 0 Staff telephones HHA, Nursing Homes / Clarify orders / etc []  - 0 Routine Transfer to another Facility (non-emergent condition) []  - 0 Routine Hospital Admission (non-emergent condition) X- 1 15 New Admissions / Manufacturing engineer / Ordering NPWT Apligraf, etc. , []  - 0 Emergency Hospital Admission (emergent condition) PROCESS - Special Needs []  - 0 Pediatric / Minor Patient Management []  - 0 Isolation Patient Management []  - 0 Hearing / Language / Visual special needs []  - 0 Assessment of Community assistance (transportation, D/C planning, etc.) []  - 0 Additional assistance / Altered mentation []  - 0 Support Surface(s) Assessment (bed, cushion, seat, etc.) INTERVENTIONS - Miscellaneous []  - 0 External ear exam []  - 0 Patient Transfer (multiple staff / Nurse, adult / Similar devices) []  - 0 Simple Staple /  Suture removal (25 or less) []  - 0 Complex Staple / Suture removal (26 or more) []  - 0 Hypo/Hyperglycemic Management (do not check if billed separately) X- 1 15 Ankle / Brachial Index (ABI) - do not check if billed separately Has the patient been seen at the hospital within the  last three years: Yes Total Score: 100 Level Of Care: New/Established - Level 3 Electronic Signature(s) Signed: 07/18/2022 4:28:11 PM By: Eduardo PaxEpps, [name] Entered By: Eduardo PaxEpps, Eduardo on 07/16/2022 10:52:29 -------------------------------------------------------------------------------- Encounter Discharge Information Details Patient Name: Date of Service: Eduardo KoyanagiLUSTER, [name] 07/16/2022 9:30 A M Medical Record Number: [id number] Patient Account Number: [id number] Date of Birth/Sex: Treating Bell: 09/27/1995 (27 y.o. Eduardo [place]M) Epps, Eduardo Primary Care Aleeta Schmaltz: PA Eduardo JordanIENT, NO Other Clinician: Referring Demari Kropp: Treating Aniela Caniglia/Extender: Eduardo CoupeStone, [name], [name] in Treatment: 0 Encounter Discharge Information Items Post Procedure Vitals Discharge Condition: Stable Temperature (F): 98.1 Ambulatory Status: Ambulatory Pulse (bpm): 79 Discharge Destination: Home Respiratory Rate (breaths/min): 18 Transportation: Private Auto Blood Pressure (mmHg): 135/89 Accompanied By: self Schedule Follow-up Appointment: Yes Clinical Summary of Care: [name], [name] ([id number]) [id number] Page 3 of 6 Electronic Signature(s) Signed: 07/18/2022 4:28:11 PM By: Eduardo PaxEpps, [name] Entered By: Eduardo PaxEpps, Eduardo on 07/16/2022 10:53:40 -------------------------------------------------------------------------------- Lower Extremity Assessment Details Patient Name: Date of Service: Eduardo LivelyLUSTER, [name] 07/16/2022 9:30 A M Medical Record Number: [id number] Patient Account Number: [id number] Date of Birth/Sex: Treating Bell: 06/17/1995 (27 y.o. Eduardo PetitM) Eduardo PaxEpps, Eduardo Primary Care Angelis Gates: PA Eduardo JordanIENT, NO Other Clinician: Referring  Ivannah Zody: Treating Carolene Gitto/Extender: Eduardo CoupeStone, [name], [name] in Treatment: 0 Edema Assessment Assessed: [Left: No] [Right: No] Edema: [Left: Ye] [Right: s] Calf Left: Right: Point of Measurement: 40 cm From Medial Instep 44 cm Ankle Left: Right: Point of Measurement: 10 cm From Medial Instep 22 cm Knee To Floor Left: Right: From Medial Instep 46 cm Vascular Assessment Pulses: Dorsalis Pedis Palpable: [Left:Yes] Doppler Audible: [Left:Yes] Blood Pressure: Brachial: [Left:135] Ankle: [Left:Dorsalis Pedis: 122 0.90] Electronic Signature(s) Signed: 07/18/2022 4:28:11 PM By: Eduardo PaxEpps, [name] Entered By: Eduardo PaxEpps, Eduardo on 07/16/2022 10:26:30 -------------------------------------------------------------------------------- Multi-Disciplinary Care Plan Details Patient Name: Date of Service: Eduardo KoyanagiLUSTER, [name] 07/16/2022 9:30 A M Medical Record Number: [id number] Patient Account Number: [id number] Date of Birth/Sex: Treating Bell: 02/07/1996 (27 y.o. Eduardo [place]M) Epps, Eduardo Primary Care Story Conti: PA Eduardo JordanIENT, NO Other Clinician: Referring Srinidhi Landers: Treating Margurite Duffy/Extender: Eduardo CoupeStone, [name], [name] in Treatment: 0 Active Inactive Necrotic Tissue Nursing Diagnoses: Knowledge deficit related to management of necrotic/devitalized tissue Goals: Patient/caregiver will verbalize understanding of reason and process for debridement of necrotic tissue Date Initiated: 07/16/2022 Target Resolution Date: 08/15/2022 [name], Serge ([id number]) [id number] Page 4 of 6 Goal Status: Active Interventions: Assess patient pain level pre-, during and post procedure and prior to discharge Provide education on necrotic tissue and debridement process Notes: Wound/Skin Impairment Nursing Diagnoses: Knowledge deficit related to ulceration/compromised skin integrity Goals: Patient/caregiver will verbalize understanding of skin care regimen Date Initiated: 07/16/2022 Target  Resolution Date: 08/15/2022 Goal Status: Active Ulcer/skin breakdown will have a volume reduction of 30% by week 4 Date Initiated: 07/16/2022 Target Resolution Date: 08/15/2022 Goal Status: Active Ulcer/skin breakdown will have a volume reduction of 50% by week 8 Date Initiated: 07/16/2022 Target Resolution Date: 09/15/2022 Goal Status: Active Ulcer/skin breakdown will have a volume reduction of 80% by week 12 Date Initiated: 07/16/2022 Target Resolution Date: 10/15/2022 Goal Status: Active Ulcer/skin breakdown will heal within 14 Bell Date Initiated: 07/16/2022 Target Resolution Date: 11/15/2022 Goal Status: Active Interventions: Assess patient/caregiver ability to obtain necessary supplies Assess patient/caregiver ability to perform ulcer/skin care regimen upon admission and as needed Assess ulceration(s) every visit Notes: Electronic Signature(s) Signed: 07/18/2022 4:28:11 PM By: Eduardo PaxEpps, [name] Entered By: Eduardo PaxEpps, Eduardo on 07/16/2022 10:32:20 -------------------------------------------------------------------------------- Pain Assessment Details Patient Name: Date of Service: Eduardo LivelyLUSTER, [name] 07/16/2022 9:30 A M Medical Record Number: [id number] Patient  Account Number: [id number] Date of Birth/Sex: Treating Bell: 01-Aug-1995 (27 y.o. [name]) [name] Primary Care Kanyah Matsushima: PA [name], NO Other Clinician: Referring Constantine Ruddick: Treating Orell Hurtado/Extender: [name] in Treatment: 0 Active Problems Location of Pain Severity and Description of Pain Patient Has Paino Yes Site Locations With Dressing Change: Yes Duration of the Pain. Constant / Intermittento Constant Rate the pain. Current Pain Level: 5 Worst Pain Level: 7 Least Pain Level: 2 Tolerable Pain Level: 5 [name], [name] ([id number]) [id number] Page 5 of 6 Character of Pain Describe the Pain: Burning Pain Management and Medication Current Pain Management: Medication: Yes Cold Application:  No Rest: Yes Massage: No Activity: No T.E.N.S.: No Heat Application: No Leg drop or elevation: No Is the Current Pain Management Adequate: Inadequate How does your wound impact your activities of daily livingo Sleep: Yes Bathing: No Appetite: No Relationship With Others: No Bladder Continence: No Emotions: No Bowel Continence: No Work: No Toileting: No Drive: No Dressing: No Hobbies: No Electronic Signature(s) Signed: 07/18/2022 4:28:11 PM By: Eduardo Pax Bell Entered By: [name] on 07/16/2022 10:10:56 -------------------------------------------------------------------------------- Patient/Caregiver Education Details Patient Name: Date of Service: [name] 4/9/2024andnbsp9:30 A M Medical Record Number: [id number] Patient Account Number: [id number] Date of Birth/Gender: Treating Bell: 1995/07/09 (27 y.o. [name] Primary Care Physician: PA [name], NO Other Clinician: Referring Physician: Treating Physician/Extender: [name] in Treatment: 0 Education Assessment Education Provided To: Patient Education Topics Provided Welcome T [facility] Packet: o Methods: Explain/Verbal Responses: State content correctly Electronic Signature(s) Signed: 07/18/2022 4:28:11 PM By: Eduardo Pax Bell Entered By: [name] on 07/16/2022 10:32:38 -------------------------------------------------------------------------------- Wound Assessment Details Patient Name: Date of Service: [name], [name] 07/16/2022 9:30 A M Medical Record Number: [id number] Patient Account Number: [id number] Date of Birth/Sex: Treating Bell: 23-Feb-1996 (27 y.o. [name] Primary Care Kaymon Denomme: PA [name], NO Other Clinician: Referring Carmeline Kowal: Treating Aviella Disbrow/Extender: [name] in Treatment: 0 Wound Status Wound Number: 1 Primary Etiology: Trauma, Other Wound Location: Left, Lateral Foot Wound Status: Open Wounding  Event: Trauma Date Acquired: 06/09/2022 Bell Of Treatment: 0 Clustered Wound: No Pouliot, [name] ([id number]) [id number] Page 6 of 6 Photos Wound Measurements Length: (cm) 1.4 Width: (cm) 1.5 Depth: (cm) 0.3 Area: (cm) 1.649 Volume: (cm) 0.495 % Reduction in Area: % Reduction in Volume: Epithelialization: None Tunneling: No Undermining: No Wound Description Classification: Full Thickness Without Exposed Suppor Exudate Amount: Medium Exudate Type: Serosanguineous Exudate Color: red, brown t Structures Foul Odor After Cleansing: No Slough/Fibrino Yes Wound Bed Granulation Amount: Medium (34-66%) Exposed Structure Granulation Quality: Red, Pink Fascia Exposed: No Necrotic Amount: Medium (34-66%) Fat Layer (Subcutaneous Tissue) Exposed: Yes Necrotic Quality: Adherent Slough Tendon Exposed: No Muscle Exposed: No Joint Exposed: No Bone Exposed: No Electronic Signature(s) Signed: 07/18/2022 4:28:11 PM By: Eduardo Pax Bell Entered By: [name] on 07/16/2022 10:27:30 -------------------------------------------------------------------------------- Vitals Details Patient Name: Date of Service: [name] Bell 07/16/2022 9:30 A M Medical Record Number: [id number] Patient Account Number: [id number] Date of Birth/Sex: Treating Bell: 09/16/1995 (27 y.o. [name]) [name] Primary Care Mazie Fencl: PA [name], NO Other Clinician: Referring Cythina Mickelsen: Treating Sherylann Vangorden/Extender: [name] in Treatment: 0 Vital Signs Time Taken: 10:11 Temperature (F): 98.1 Height (in): 69 Pulse (bpm): 79 Source: Stated Respiratory Rate (breaths/min): 18 Weight (lbs): 250 Blood Pressure (mmHg): 135/89 Source: Stated Reference Range: 80 - 120 mg / dl Body Mass Index (BMI): 36.9 Electronic Signature(s) Signed: 07/18/2022 4:28:11 PM By: Eduardo Pax Bell Entered  ByYevonne Bell on 07/16/2022 10:11:30

## 2022-07-23 ENCOUNTER — Encounter: Payer: Worker's Compensation | Admitting: Physician Assistant

## 2022-07-23 DIAGNOSIS — T25322A Burn of third degree of left foot, initial encounter: Secondary | ICD-10-CM | POA: Diagnosis not present

## 2022-07-23 NOTE — Progress Notes (Signed)
[name] ([id number]) [id number] Page 1 of 5 Visit Report for 07/23/2022 Chief Complaint Document Details Patient Name: Date of Service: [name], [name] 07/23/2022 2:30 PM Medical Record Number: [id number] Patient Account Number: [id number] Date of Birth/Sex: Treating Bell: March 11, 1996 (27 y.o. [name]) [name] Primary Care Provider: PA [name], NO Other Clinician: Referring Provider: Treating Provider/Extender: [name] in Treatment: 1 Information Obtained from: Patient Chief Complaint Left foot thermal burn Electronic Signature(s) Signed: 07/23/2022 2:39:31 PM By: Eduardo Derry Eduardo-C Entered By: [name] on 07/23/2022 14:39:31 -------------------------------------------------------------------------------- HPI Details Patient Name: Date of Service: [name] Bell 07/23/2022 2:30 PM Medical Record Number: [id number] Patient Account Number: [id number] Date of Birth/Sex: Treating Bell: [birth year] (27 y.o. [name] Primary Care Provider: PA [name], NO Other Clinician: Referring Provider: Treating Provider/Extender: [name] in Treatment: 1 History of Present Illness HPI Description: 07-16-2022 upon evaluation today patient presents for evaluation in the clinic as a result of an injury that occurred on 06-09-2022. He tells me that he actually works on an [employer] when he slipped and his foot actually got caught in a contraction/machine that caused what appears to be a significant friction burn to the dorsal surface of his foot. Initially they were not sure that he had not broken anything and actually he was sent to [facility] in order to get this checked out. Subsequently ended up not have anything broken which is great news and I am pleased in that regard. With that being said unfortunately he did have issues here with what appears to be a significant burn and while the majority this was more of a  stage II I think the worst part of this is a stage III friction burn which again is thermal in nature to the top of his foot. There is slough and biofilm buildup. This is getting to be cleared away. The good news is the patient really does not have any major medical problems. He is in pretty good shape otherwise which is great news. 07-23-2022 upon evaluation today patient's wound is actually showed signs of excellent improvement and actually very pleased with where we stand. I do not see any evidence of infection and overall we are making really good progress here. Electronic Signature(s) Signed: 07/23/2022 4:30:27 PM By: Eduardo Derry Eduardo-C Entered By: [name] on 07/23/2022 16:30:27 -------------------------------------------------------------------------------- Physical Exam Details Patient Name: Date of Service: [name], [name] 07/23/2022 2:30 PM Medical Record Number: [id number] Patient Account Number: [id number] Date of Birth/Sex: Treating Bell: 10-08-1995 (27 y.o. [name] Primary Care Provider: PA [name], NO Other Clinician: Referring Provider: Treating Provider/Extender: [name] in Treatment: 1 Constitutional Well-nourished and well-hydrated in no acute distress. Respiratory normal breathing without difficulty. Psychiatric [name] ([id number]) [id number] Page 2 of 5 this patient is able to make decisions and demonstrates good insight into disease process. Alert and Oriented x 3. pleasant and cooperative. Notes Upon inspection patient's wound bed actually showed signs of good granulation epithelization at this point. Fortunately I do not see any evidence of infection locally nor systemically which is great news and I do believe that he is making excellent progress. Electronic Signature(s) Signed: 07/23/2022 4:30:45 PM By: Eduardo Derry Eduardo-C Entered By: [name] on 07/23/2022  16:30:45 -------------------------------------------------------------------------------- Physician Orders Details Patient Name: Date of Service: [name], [name] 07/23/2022 2:30 PM Medical Record Number: [id number] Patient Account Number: [id number] Date of Birth/Sex: Treating Bell: 06-11-1995 (27 y.o. [name]) [name] Primary Care  Provider: PA [name] Other Clinician: Referring Provider: Treating Provider/Extender: [name] in Treatment: 1 Verbal / Phone Orders: No Diagnosis Coding ICD-10 Coding Code Description T25.322A Burn of third degree of left foot, initial encounter L97.522 Non-pressure chronic ulcer of other part of left foot with fat layer exposed Follow-up Appointments Return Appointment in 1 week. Bathing/ Shower/ Hygiene May shower; gently cleanse wound with antibacterial soap, rinse and pat dry prior to dressing wounds Anesthetic (Use 'Patient Medications' Section for Anesthetic Order Entry) Lidocaine applied to wound bed Wound Treatment Wound #1 - Foot Wound Laterality: Left, Lateral Cleanser: Soap and Water 3 x Per Week/30 Days Discharge Instructions: Gently cleanse wound with antibacterial soap, rinse and pat dry prior to dressing wounds Prim Dressing: Prisma 4.34 (in) 3 x Per Week/30 Days ary Discharge Instructions: Moisten w/normal saline or sterile water; Cover wound as directed. Do not remove from wound bed. Secondary Dressing: Coverlet Latex-Free Fabric Adhesive Dressings 3 x Per Week/30 Days Discharge Instructions: 1.5 x 2 Electronic Signature(s) Signed: 07/23/2022 4:53:35 PM By: Eduardo Derry Eduardo-C Signed: 07/24/2022 4:41:51 PM By: [name] Entered By: [name] on 07/23/2022 15:04:23 -------------------------------------------------------------------------------- Problem List Details Patient Name: Date of Service: [name] 07/23/2022 2:30 PM Medical Record Number: [id number] Patient Account Number: [id number] Date of  Birth/Sex: Treating Bell: 12-21-1995 (27 y.o. [name] Primary Care Provider: PA [name], NO Other Clinician: Referring Provider: Treating Provider/Extender: [name] in Treatment: 1 Active Problems ICD-10 Encounter [name] ([id number]) [id number] Page 3 of 5 Encounter Code Description Active Date MDM Diagnosis T25.322A Burn of third degree of left foot, initial encounter 07/16/2022 No Yes L97.522 Non-pressure chronic ulcer of other part of left foot with fat layer exposed 07/16/2022 No Yes Inactive Problems Resolved Problems Electronic Signature(s) Signed: 07/23/2022 2:39:18 PM By: Eduardo Derry Eduardo-C Entered By: [name] on 07/23/2022 14:39:18 -------------------------------------------------------------------------------- Progress Note Details Patient Name: Date of Service: [name], [name] 07/23/2022 2:30 PM Medical Record Number: [id number] Patient Account Number: [id number] Date of Birth/Sex: Treating Bell: 1995/05/31 (27 y.o. [name] Primary Care Provider: PA [name], NO Other Clinician: Referring Provider: Treating Provider/Extender: [name] in Treatment: 1 Subjective Chief Complaint Information obtained from Patient Left foot thermal burn History of Present Illness (HPI) 07-16-2022 upon evaluation today patient presents for evaluation in the clinic as a result of an injury that occurred on 06-09-2022. He tells me that he actually works on an [employer] when he slipped and his foot actually got caught in a contraction/machine that caused what appears to be a significant friction burn to the dorsal surface of his foot. Initially they were not sure that he had not broken anything and actually he was sent to [facility] in order to get this checked out. Subsequently ended up not have anything broken which is great news and I am pleased in that regard. With that being said  unfortunately he did have issues here with what appears to be a significant burn and while the majority this was more of a stage II I think the worst part of this is a stage III friction burn which again is thermal in nature to the top of his foot. There is slough and biofilm buildup. This is getting to be cleared away. The good news is the patient really does not have any major medical problems. He is in pretty good shape otherwise which is great news. 07-23-2022 upon evaluation today patient's wound is  actually showed signs of excellent improvement and actually very pleased with where we stand. I do not see any evidence of infection and overall we are making really good progress here. Objective Constitutional Well-nourished and well-hydrated in no acute distress. Vitals Time Taken: 2:38 PM, Height: 69 in, Weight: 250 lbs, BMI: 36.9, Temperature: 98.2 F, Pulse: 58 bpm, Respiratory Rate: 16 breaths/min, Blood Pressure: 138/91 mmHg. Respiratory normal breathing without difficulty. Psychiatric this patient is able to make decisions and demonstrates good insight into disease process. Alert and Oriented x 3. pleasant and cooperative. General Notes: Upon inspection patient's wound bed actually showed signs of good granulation epithelization at this point. Fortunately I do not see any evidence of infection locally nor systemically which is great news and I do believe that he is making excellent progress. Integumentary (Hair, Skin) Wound #1 status is Open. Original cause of wound was Thermal Burn. The date acquired was: 06/09/2022. The wound has been in treatment 1 weeks. The wound is located on the Left,Lateral Foot. The wound measures 1cm length x 1.1cm width x 0.3cm depth; 0.864cm^2 area and 0.259cm^3 volume. There is Fat Layer (Subcutaneous Tissue) exposed. There is no tunneling or undermining noted. There is a medium amount of serosanguineous drainage noted. There is medium Watts, [name]  ([id number]) [id number] Page 4 of 5 (34-66%) red, pink granulation within the wound bed. There is a medium (34-66%) amount of necrotic tissue within the wound bed including Adherent Slough. Assessment Active Problems ICD-10 Burn of third degree of left foot, initial encounter Non-pressure chronic ulcer of other part of left foot with fat layer exposed Plan Follow-up Appointments: Return Appointment in 1 week. Bathing/ Shower/ Hygiene: May shower; gently cleanse wound with antibacterial soap, rinse and pat dry prior to dressing wounds Anesthetic (Use 'Patient Medications' Section for Anesthetic Order Entry): Lidocaine applied to wound bed WOUND #1: - Foot Wound Laterality: Left, Lateral Cleanser: Soap and Water 3 x Per Week/30 Days Discharge Instructions: Gently cleanse wound with antibacterial soap, rinse and pat dry prior to dressing wounds Prim Dressing: Prisma 4.34 (in) 3 x Per Week/30 Days ary Discharge Instructions: Moisten w/normal saline or sterile water; Cover wound as directed. Do not remove from wound bed. Secondary Dressing: Coverlet Latex-Free Fabric Adhesive Dressings 3 x Per Week/30 Days Discharge Instructions: 1.5 x 2 1. I would recommend that we have the patient continue to monitor for any signs of active infection or overall worsening in general I think that he is doing great the collagen is excellent and organ to continue with the plan currently. 2. I am good recommend as well the patient should continue with the Band-Aid to cover which I think is doing a great job. We will see patient back for reevaluation in 1 week here in the clinic. If anything worsens or changes patient will contact our office for additional recommendations. Electronic Signature(s) Signed: 07/23/2022 4:31:16 PM By: Eduardo Derry Eduardo-C Entered By: [name] on 07/23/2022 16:31:16 -------------------------------------------------------------------------------- SuperBill  Details Patient Name: Date of Service: JERIMYAH, VANDUNK 07/23/2022 Medical Record Number: [id number] Patient Account Number: [id number] Date of Birth/Sex: Treating Bell: [birth year] (27 y.o. [name]) [name] Primary Care Provider: PA [name], NO Other Clinician: Referring Provider: Treating Provider/Extender: [name] in Treatment: 1 Diagnosis Coding ICD-10 Codes Code Description T25.322A Burn of third degree of left foot, initial encounter L97.522 Non-pressure chronic ulcer of other part of left foot with fat layer exposed Facility Procedures : CPT4 Code: [id number] Description: 52841 - WOUND CARE VISIT-LEV 2  EST PT Modifier: Quantity: 1 Physician Procedures : CPT4 Code Description Modifier [id number] 99213 - WC PHYS LEVEL 3 - EST PT ICD-10 Diagnosis Description T25.322A Burn of third degree of left foot, initial encounter [place], [name] ([id number]) [id number] Pag [phone]  Non-pressure chronic ulcer of other part of left foot with fat layer exposed Quantity: 1 e 5 of 5 Electronic Signature(s) Signed: 07/23/2022 4:32:39 PM By: Eduardo Derry Eduardo-C Entered By: [name] on 07/23/2022 16:32:38

## 2022-07-25 NOTE — Progress Notes (Signed)
[name] ([id number]) [id number] Page 1 of 8 Visit Report for 07/23/2022 Arrival Information Details Patient Name: Date of Service: [name], DOSWELL 07/23/2022 2:30 PM Medical Record Number: [id number] Patient Account Number: [id number] Date of Birth/Sex: Treating RN: 1995-09-18 (27 y.o. [name]) [name] Primary Care Dmitry Macomber: PA [name], NO Other Clinician: Referring Jamye Balicki: Treating Sencere Symonette/Extender: [name] in Treatment: 1 Visit Information History Since Last Visit Added or deleted any medications: No Patient Arrived: Ambulatory Any new allergies or adverse reactions: No Arrival Time: 14:38 Had a fall or experienced change in No Accompanied By: self activities of daily living that may affect Transfer Assistance: None risk of falls: Patient Identification Verified: Yes Signs or symptoms of abuse/neglect since last visito No Secondary Verification Process Completed: Yes Hospitalized since last visit: No Patient Requires Transmission-Based Precautions: No Implantable device outside of the clinic excluding No Patient Has Alerts: No cellular tissue based products placed in the center since last visit: Has Dressing in Place as Prescribed: Yes Pain Present Now: No Electronic Signature(s) Signed: 07/24/2022 4:41:51 PM By: [name] RN Entered By: [name] on 07/23/2022 14:38:54 -------------------------------------------------------------------------------- Clinic Level of Care Assessment Details Patient Name: Date of Service: [name], [name] 07/23/2022 2:30 PM Medical Record Number: [id number] Patient Account Number: [id number] Date of Birth/Sex: Treating RN: [birth year] (27 y.o. [name]) [name] Primary Care Herny Scurlock: PA [name], NO Other Clinician: Referring [name]: Treating [name]/Extender: [name] in Treatment: 1 Clinic Level of Care Assessment Items TOOL 4 Quantity Score X- 1 0 Use when  only an EandM is performed on FOLLOW-UP visit ASSESSMENTS - Nursing Assessment / Reassessment X- 1 10 Reassessment of Co-morbidities (includes updates in patient status) X- 1 5 Reassessment of Adherence to Treatment Plan ASSESSMENTS - Wound and Skin A ssessment / Reassessment X - Simple Wound Assessment / Reassessment - one wound 1 5 []  - 0 Complex Wound Assessment / Reassessment - multiple wounds []  - 0 Dermatologic / Skin Assessment (not related to wound area) ASSESSMENTS - Focused Assessment []  - 0 Circumferential Edema Measurements - multi extremities []  - 0 Nutritional Assessment / Counseling / Intervention []  - 0 Lower Extremity Assessment (monofilament, tuning fork, pulses) []  - 0 Peripheral Arterial Disease Assessment (using hand held doppler) ASSESSMENTS - Ostomy and/or Continence Assessment and Care []  - 0 Incontinence Assessment and Management []  - 0 Ostomy Care Assessment and Management (repouching, etc.) PROCESS - Coordination of Care X - Simple Patient / Family Education for ongoing care 1 15 [name] ([id number]) [id number] Page 2 of 8 []  - 0 Complex (extensive) Patient / Family Education for ongoing care []  - 0 Staff obtains Chiropractor, Records, T Results / Process Orders est []  - 0 Staff telephones HHA, Nursing Homes / Clarify orders / etc []  - 0 Routine Transfer to another Facility (non-emergent condition) []  - 0 Routine Hospital Admission (non-emergent condition) []  - 0 New Admissions / Manufacturing engineer / Ordering NPWT Apligraf, etc. , []  - 0 Emergency Hospital Admission (emergent condition) X- 1 10 Simple Discharge Coordination []  - 0 Complex (extensive) Discharge Coordination PROCESS - Special Needs []  - 0 Pediatric / Minor Patient Management []  - 0 Isolation Patient Management []  - 0 Hearing / Language / Visual special needs []  - 0 Assessment of Community assistance (transportation, D/C planning,  etc.) []  - 0 Additional assistance / Altered mentation []  - 0 Support Surface(s) Assessment (bed, cushion, seat, etc.) INTERVENTIONS - Wound Cleansing / Measurement X - Simple Wound Cleansing - one wound  1 5  - 0 Complex Wound Cleansing - multiple wounds X- 1 5 Wound Imaging (photographs - any number of wounds)  - 0 Wound Tracing (instead of photographs) X- 1 5 Simple Wound Measurement - one wound  - 0 Complex Wound Measurement - multiple wounds INTERVENTIONS - Wound Dressings X - Small Wound Dressing one or multiple wounds 1 10  - 0 Medium Wound Dressing one or multiple wounds  - 0 Large Wound Dressing one or multiple wounds  - 0 Application of Medications - topical  - 0 Application of Medications - injection INTERVENTIONS - Miscellaneous  - 0 External ear exam  - 0 Specimen Collection (cultures, biopsies, blood, body fluids, etc.)  - 0 Specimen(s) / Culture(s) sent or taken to Lab for analysis  - 0 Patient Transfer (multiple staff / Nurse, adult / Similar devices)  - 0 Simple Staple / Suture removal (25 or less)  - 0 Complex Staple / Suture removal (26 or more)  - 0 Hypo / Hyperglycemic Management (close monitor of Blood Glucose)  - 0 Ankle / Brachial Index (ABI) - do not check if billed separately X- 1 5 Vital Signs Has the patient been seen at the hospital within the last three years: Yes Total Score: 75 Level Of Care: New/Established - Level 2 Electronic Signature(s) Signed: 07/24/2022 4:41:51 PM By: [name] RN Entered By: [name] on 07/23/2022 15:09:09 [name] ([id number]) [id number] Page 3 of 8 -------------------------------------------------------------------------------- Encounter Discharge Information Details Patient Name: Date of Service: [name] 07/23/2022 2:30 PM Medical Record Number: [id number] Patient Account Number: [id number] Date of Birth/Sex: Treating RN: 06/10/1995  (27 y.o. [name]) [name] Primary Care Wave Calzada: PA [name], NO Other Clinician: Referring Hezekiah Veltre: Treating Timmy Bubeck/Extender: [name] in Treatment: 1 Encounter Discharge Information Items Discharge Condition: Stable Ambulatory Status: Ambulatory Discharge Destination: Home Transportation: Private Auto Accompanied By: self Schedule Follow-up Appointment: Yes Clinical Summary of Care: Electronic Signature(s) Signed: 07/24/2022 4:41:51 PM By: [name] RN Entered By: [name] on 07/23/2022 15:09:47 -------------------------------------------------------------------------------- Lower Extremity Assessment Details Patient Name: Date of Service: [name], [name] 07/23/2022 2:30 PM Medical Record Number: [id number] Patient Account Number: [id number] Date of Birth/Sex: Treating RN: [birth year] (27 y.o. [name] Primary Care Joselyn Edling: PA [name], NO Other Clinician: Referring Jacolby Risby: Treating Hala Narula/Extender: [name] in Treatment: 1 Edema Assessment Assessed: [Left: No] [Right: No] Edema: [Left: N] [Right: o] Calf Left: Right: Point of Measurement: 40 cm From Medial Instep 39.3 cm Ankle Left: Right: Point of Measurement: 10 cm From Medial Instep 22 cm Vascular Assessment Pulses: Dorsalis Pedis Palpable: [Left:Yes] Electronic Signature(s) Signed: 07/24/2022 4:41:51 PM By: [name] RN Entered By: [name] on 07/23/2022 14:43:17 -------------------------------------------------------------------------------- Multi Wound Chart Details Patient Name: Date of Service: [name] NU 07/23/2022 2:30 PM Medical Record Number: [id number] Patient Account Number: [id number] Date of Birth/Sex: Treating RN: 01/09/1996 (27 y.o. [name] Primary Care Arihant Pennings: PA [name], NO Other Clinician: Referring Verbie Babic: Treating Mujtaba Bollig/Extender: [name] in Treatment: 1 [name] ([id number])  [id number] Page 4 of 8 Vital Signs Height(in): 69 Pulse(bpm): 58 Weight(lbs): 250 Blood Pressure(mmHg): 138/91 Body Mass Index(BMI): 36.9 Temperature(F): 98.2 Respiratory Rate(breaths/min): 16 [1:Photos:] [N/A:N/A] Left, Lateral Foot N/A N/A Wound Location: Thermal Burn N/A N/A Wounding Event: 3rd degree Burn N/A N/A Primary Etiology: 06/09/2022 N/A N/A Date Acquired: 1 N/A N/A Weeks of Treatment: Open N/A N/A Wound Status: No N/A N/A Wound Recurrence: 1x1.1x0.3 N/A N/A Measurements L x W x  D (cm) 0.864 N/A N/A A (cm) : rea 0.259 N/A N/A Volume (cm) : 47.60% N/A N/A % Reduction in A rea: 47.70% N/A N/A % Reduction in Volume: Full Thickness Without Exposed N/A N/A Classification: Support Structures Medium N/A N/A Exudate Amount: Serosanguineous N/A N/A Exudate Type: red, brown N/A N/A Exudate Color: Medium (34-66%) N/A N/A Granulation Amount: Red, Pink N/A N/A Granulation Quality: Medium (34-66%) N/A N/A Necrotic Amount: Fat Layer (Subcutaneous Tissue): Yes N/A N/A Exposed Structures: Fascia: No Tendon: No Muscle: No Joint: No Bone: No None N/A N/A Epithelialization: Treatment Notes Electronic Signature(s) Signed: 07/24/2022 4:41:51 PM By: [name] RN Entered By: [name] on 07/23/2022 14:43:22 -------------------------------------------------------------------------------- Multi-Disciplinary Care Plan Details Patient Name: Date of Service: [name] NU 07/23/2022 2:30 PM Medical Record Number: [id number] Patient Account Number: [id number] Date of Birth/Sex: Treating RN: 1995-12-26 (27 y.o. [name] Primary Care Katrina Daddona: PA [name], NO Other Clinician: Referring Laruen Risser: Treating Chandan Fly/Extender: [name] in Treatment: 1 Active Inactive Necrotic Tissue Nursing Diagnoses: Knowledge deficit related to management of necrotic/devitalized tissue Goals: Patient/caregiver will  verbalize understanding of reason and process for debridement of necrotic tissue Perusse, Lynwood Bell ([id number]) [id number] Page 5 of 8 Date Initiated: 07/16/2022 Target Resolution Date: 08/15/2022 Goal Status: Active Interventions: Assess patient pain level pre-, during and post procedure and prior to discharge Provide education on necrotic tissue and debridement process Notes: Wound/Skin Impairment Nursing Diagnoses: Knowledge deficit related to ulceration/compromised skin integrity Goals: Patient/caregiver will verbalize understanding of skin care regimen Date Initiated: 07/16/2022 Target Resolution Date: 08/15/2022 Goal Status: Active Ulcer/skin breakdown will have a volume reduction of 30% by week 4 Date Initiated: 07/16/2022 Target Resolution Date: 08/15/2022 Goal Status: Active Ulcer/skin breakdown will have a volume reduction of 50% by week 8 Date Initiated: 07/16/2022 Target Resolution Date: 09/15/2022 Goal Status: Active Ulcer/skin breakdown will have a volume reduction of 80% by week 12 Date Initiated: 07/16/2022 Target Resolution Date: 10/15/2022 Goal Status: Active Ulcer/skin breakdown will heal within 14 weeks Date Initiated: 07/16/2022 Target Resolution Date: 11/15/2022 Goal Status: Active Interventions: Assess patient/caregiver ability to obtain necessary supplies Assess patient/caregiver ability to perform ulcer/skin care regimen upon admission and as needed Assess ulceration(s) every visit Notes: Electronic Signature(s) Signed: 07/24/2022 4:41:51 PM By: [name] RN Entered By: [name] on 07/23/2022 14:43:38 -------------------------------------------------------------------------------- Pain Assessment Details Patient Name: Date of Service: EHAB, HUMBER 07/23/2022 2:30 PM Medical Record Number: [id number] Patient Account Number: [id number] Date of Birth/Sex: Treating RN: 06/14/1995 (27 y.o. [name] Primary Care Aysiah Jurado: PA [name], NO Other  Clinician: Referring Missey Hasley: Treating Brehanna Deveny/Extender: [name] in Treatment: 1 Active Problems Location of Pain Severity and Description of Pain Patient Has Paino No Site Locations [place], Lynwood Bell ([id number]) [id number] Page 6 of 8 Pain Management and Medication Current Pain Management: Electronic Signature(s) Signed: 07/24/2022 4:41:51 PM By: [name] RN Entered By: [name] on 07/23/2022 14:39:25 -------------------------------------------------------------------------------- Patient/Caregiver Education Details Patient Name: Date of Service: Rosary Lively 4/16/2024andnbsp2:30 PM Medical Record Number: [id number] Patient Account Number: [id number] Date of Birth/Gender: Treating RN: 09-10-1995 (27 y.o. [name] Primary Care Physician: PA [name], NO Other Clinician: Referring Physician: Treating Physician/Extender: [name] in Treatment: 1 Education Assessment Education Provided To: Patient Education Topics Provided Wound Debridement: Handouts: Wound Debridement Methods: Explain/Verbal Responses: State content correctly Electronic Signature(s) Signed: 07/24/2022 4:41:51 PM By: [name] RN Entered By: [name] on 07/23/2022 14:43:54 -------------------------------------------------------------------------------- Wound Assessment Details Patient Name: Date of Service: [name]  NU 07/23/2022 2:30 PM Medical Record Number: [id number] Patient Account Number: [id number] Date of Birth/Sex: Treating RN: 1995-07-01 (27 y.o. [name]) [name] Primary Care Germaine Shenker: PA [name], NO Other Clinician: Referring Zaara Sprowl: Treating Skarlet Lyons/Extender: [name] in Treatment: 1 Wound Status Wound Number: 1 Primary Etiology: 3rd degree Burn Wound Location: Left, Lateral Foot Wound Status: Open Wounding Event: Thermal Burn Date Acquired: 06/09/2022 Weeks Of Treatment:  1 Clustered Wound: No Photos Beazley, Lynwood Bell ([id number]) [id number] Page 7 of 8 Wound Measurements Length: (cm) 1 Width: (cm) 1.1 Depth: (cm) 0.3 Area: (cm) 0.864 Volume: (cm) 0.259 % Reduction in Area: 47.6% % Reduction in Volume: 47.7% Epithelialization: None Tunneling: No Undermining: No Wound Description Classification: Full Thickness Without Exposed Support Structures Exudate Amount: Medium Exudate Type: Serosanguineous Exudate Color: red, brown Foul Odor After Cleansing: No Slough/Fibrino Yes Wound Bed Granulation Amount: Medium (34-66%) Exposed Structure Granulation Quality: Red, Pink Fascia Exposed: No Necrotic Amount: Medium (34-66%) Fat Layer (Subcutaneous Tissue) Exposed: Yes Necrotic Quality: Adherent Slough Tendon Exposed: No Muscle Exposed: No Joint Exposed: No Bone Exposed: No Treatment Notes Wound #1 (Foot) Wound Laterality: Left, Lateral Cleanser Soap and Water Discharge Instruction: Gently cleanse wound with antibacterial soap, rinse and pat dry prior to dressing wounds Peri-Wound Care Topical Primary Dressing Prisma 4.34 (in) Discharge Instruction: Moisten w/normal saline or sterile water; Cover wound as directed. Do not remove from wound bed. Secondary Dressing Coverlet Latex-Free Fabric Adhesive Dressings Discharge Instruction: 1.5 x 2 Secured With Compression Wrap Compression Stockings Add-Ons Electronic Signature(s) Signed: 07/24/2022 4:41:51 PM By: [name] RN Entered By: [name] on 07/23/2022 14:42:35 -------------------------------------------------------------------------------- Vitals Details Patient Name: Date of Service: [name] NU 07/23/2022 2:30 PM Medical Record Number: [id number] Patient Account Number: [id number] Date of Birth/Sex: Treating RN: May 16, 1995 (27 y.o. [name]) [name] Primary Care Paraskevi Funez: PA [name], NO Other Clinician: Referring Chaske Paskett: Treating Ilyas Lipsitz/Extender: [name] in Treatment: 1 Vital Signs Time Taken: 14:38 Temperature (F): 98.2 Height (in): 69 Pulse (bpm): 58 Weight (lbs): 250 Respiratory Rate (breaths/min): 16 Body Mass Index (BMI): 36.9 Blood Pressure (mmHg): 138/91 Reference Range: 80 - 120 mg / dl Seney, Dream ([id number]) [id number] Page 8 of 8 Electronic Signature(s) Signed: 07/24/2022 4:41:51 PM By: [name] RN Entered By: [name] on 07/23/2022 14:39:15

## 2022-07-30 ENCOUNTER — Encounter: Payer: Worker's Compensation | Admitting: Physician Assistant

## 2022-07-30 DIAGNOSIS — T25322A Burn of third degree of left foot, initial encounter: Secondary | ICD-10-CM | POA: Diagnosis not present

## 2022-07-31 NOTE — Progress Notes (Addendum)
[name] ([id number]) [id number] Page 1 of 5 Visit Report for 07/30/2022 Chief Complaint Document Details Patient Name: Date of Service: [name], [name] 07/30/2022 2:30 PM Medical Record Number: [id number] Patient Account Number: [id number] Date of Birth/Sex: Treating RN: [birth year] (27 y.o. [name]) [name] Primary Care Provider: PA [name], NO Other Clinician: Referring Provider: Treating Provider/Extender: [name] in Treatment: 2 Information Obtained from: Patient Chief Complaint Left foot thermal burn Electronic Signature(s) Signed: 07/30/2022 2:43:16 PM By: [name] PA-C Entered By: [name] on 07/30/2022 14:43:15 -------------------------------------------------------------------------------- HPI Details Patient Name: Date of Service: [name], [name] 07/30/2022 2:30 PM Medical Record Number: [id number] Patient Account Number: [id number] Date of Birth/Sex: Treating RN: March 16, 1996 (27 y.o. [name] Primary Care Provider: PA [name], NO Other Clinician: Referring Provider: Treating Provider/Extender: [name] in Treatment: 2 History of Present Illness HPI Description: 07-16-2022 upon evaluation today patient presents for evaluation in the clinic as a result of an injury that occurred on 06-09-2022. He tells me that he actually works on an [employer] when he slipped and his foot actually got caught in a contraction/machine that caused what appears to be a significant friction burn to the dorsal surface of his foot. Initially they were not sure that he had not broken anything and actually he was sent to [facility] in order to get this checked out. Subsequently ended up not have anything broken which is great news and I am pleased in that regard. With that being said unfortunately he did have issues here with what appears to be a significant burn and while the majority this was more of a  stage II I think the worst part of this is a stage III friction burn which again is thermal in nature to the top of his foot. There is slough and biofilm buildup. This is getting to be cleared away. The good news is the patient really does not have any major medical problems. He is in pretty good shape otherwise which is great news. 07-23-2022 upon evaluation today patient's wound is actually showed signs of excellent improvement and actually very pleased with where we stand. I do not see any evidence of infection and overall we are making really good progress here. 07-30-2022 upon evaluation today patient appears to be doing well currently in regard to his wound in fact this is measuring significantly smaller I think that he is doing excellent as far as healing is concerned I think it is probably very close to complete resolution. I am figuring within a couple of weeks he will be done. Electronic Signature(s) Signed: 07/30/2022 4:57:33 PM By: [name] PA-C Entered By: [name] on 07/30/2022 16:57:33 -------------------------------------------------------------------------------- Physical Exam Details Patient Name: Date of Service: [name], [name] 07/30/2022 2:30 PM Medical Record Number: [id number] Patient Account Number: [id number] Date of Birth/Sex: Treating RN: 01/11/1996 (27 y.o. [name] Primary Care Provider: PA [name], NO Other Clinician: Referring Provider: Treating Provider/Extender: [name] in Treatment: 2 Constitutional Well-nourished and well-hydrated in no acute distress. Respiratory Fedie, [name] ([id number]) [id number] Page 2 of 5 normal breathing without difficulty. Psychiatric this patient is able to make decisions and demonstrates good insight into disease process. Alert and Oriented x 3. pleasant and cooperative. Notes Upon inspection patient's wound bed showed signs of good granulation epithelization at this  point. Fortunately I do not see any evidence of active infection locally or systemically which is great news and in general I do  believe that we are moving in the right direction. No fevers, chills, nausea, vomiting, or diarrhea. Electronic Signature(s) Signed: 07/30/2022 4:57:55 PM By: [name] PA-C Entered By: [name] on 07/30/2022 16:57:54 -------------------------------------------------------------------------------- Physician Orders Details Patient Name: Date of Service: [name], [name] 07/30/2022 2:30 PM Medical Record Number: [id number] Patient Account Number: [id number] Date of Birth/Sex: Treating RN: 1995-04-13 (27 y.o. [name]) [name] Primary Care Provider: PA [name], NO Other Clinician: Referring Provider: Treating Provider/Extender: [name] in Treatment: 2 Verbal / Phone Orders: No Diagnosis Coding ICD-10 Coding Code Description T25.322A Burn of third degree of left foot, initial encounter L97.522 Non-pressure chronic ulcer of other part of left foot with fat layer exposed Follow-up Appointments Return Appointment in 1 week. Bathing/ Shower/ Hygiene May shower; gently cleanse wound with antibacterial soap, rinse and pat dry prior to dressing wounds Anesthetic (Use 'Patient Medications' Section for Anesthetic Order Entry) Lidocaine applied to wound bed Wound Treatment Wound #1 - Foot Wound Laterality: Left, Lateral Cleanser: Soap and Water 3 x Per Week/30 Days Discharge Instructions: Gently cleanse wound with antibacterial soap, rinse and pat dry prior to dressing wounds Prim Dressing: Prisma 4.34 (in) 3 x Per Week/30 Days ary Discharge Instructions: Moisten w/normal saline or sterile water; Cover wound as directed. Do not remove from wound bed. Secondary Dressing: Coverlet Latex-Free Fabric Adhesive Dressings 3 x Per Week/30 Days Discharge Instructions: 1.5 x 2 Electronic Signature(s) Signed: 07/30/2022 4:05:57 PM By: [name]  RN Signed: 07/30/2022 4:59:06 PM By: [name] PA-C Entered By: [name] on 07/30/2022 14:53:38 -------------------------------------------------------------------------------- Problem List Details Patient Name: Date of Service: [name] 07/30/2022 2:30 PM Medical Record Number: [id number] Patient Account Number: [id number] Date of Birth/Sex: Treating RN: 18-Feb-1996 (27 y.o. [name] Primary Care Provider: PA [name], NO Other Clinician: Referring Provider: Treating Provider/Extender: [name] in Treatment: 2 Active Problems [name] ([id number]) [id number] Page 3 of 5 ICD-10 Encounter Code Description Active Date MDM Diagnosis T25.322A Burn of third degree of left foot, initial encounter 07/16/2022 No Yes L97.522 Non-pressure chronic ulcer of other part of left foot with fat layer exposed 07/16/2022 No Yes Inactive Problems Resolved Problems Electronic Signature(s) Signed: 07/30/2022 2:43:12 PM By: [name] PA-C Entered By: [name] on 07/30/2022 14:43:12 -------------------------------------------------------------------------------- Progress Note Details Patient Name: Date of Service: [name], [name] 07/30/2022 2:30 PM Medical Record Number: [id number] Patient Account Number: [id number] Date of Birth/Sex: Treating RN: Nov 11, 1995 (27 y.o. [name] Primary Care Provider: PA [name], NO Other Clinician: Referring Provider: Treating Provider/Extender: [name] in Treatment: 2 Subjective Chief Complaint Information obtained from Patient Left foot thermal burn History of Present Illness (HPI) 07-16-2022 upon evaluation today patient presents for evaluation in the clinic as a result of an injury that occurred on 06-09-2022. He tells me that he actually works on an [employer] when he slipped and his foot actually got caught in a contraction/machine that caused what appears to  be a significant friction burn to the dorsal surface of his foot. Initially they were not sure that he had not broken anything and actually he was sent to [facility] in order to get this checked out. Subsequently ended up not have anything broken which is great news and I am pleased in that regard. With that being said unfortunately he did have issues here with what appears to be a significant burn and while the majority this was more of a stage II I think  the worst part of this is a stage III friction burn which again is thermal in nature to the top of his foot. There is slough and biofilm buildup. This is getting to be cleared away. The good news is the patient really does not have any major medical problems. He is in pretty good shape otherwise which is great news. 07-23-2022 upon evaluation today patient's wound is actually showed signs of excellent improvement and actually very pleased with where we stand. I do not see any evidence of infection and overall we are making really good progress here. 07-30-2022 upon evaluation today patient appears to be doing well currently in regard to his wound in fact this is measuring significantly smaller I think that he is doing excellent as far as healing is concerned I think it is probably very close to complete resolution. I am figuring within a couple of weeks he will be done. Objective Constitutional Well-nourished and well-hydrated in no acute distress. Vitals Time Taken: 2:41 PM, Height: 69 in, Weight: 250 lbs, BMI: 36.9, Temperature: 98.1 F, Pulse: 84 bpm, Respiratory Rate: 18 breaths/min, Blood Pressure: 144/78 mmHg. Respiratory normal breathing without difficulty. Psychiatric this patient is able to make decisions and demonstrates good insight into disease process. Alert and Oriented x 3. pleasant and cooperative. General Notes: Upon inspection patient's wound bed showed signs of good granulation epithelization at this point. Fortunately I  do not see any evidence of Strick, [name] ([id number]) [id number] Page 4 of 5 active infection locally or systemically which is great news and in general I do believe that we are moving in the right direction. No fevers, chills, nausea, vomiting, or diarrhea. Integumentary (Hair, Skin) Wound #1 status is Open. Original cause of wound was Thermal Burn. The date acquired was: 06/09/2022. The wound has been in treatment 2 weeks. The wound is located on the Left,Lateral Foot. The wound measures 0.5cm length x 0.4cm width x 0.1cm depth; 0.157cm^2 area and 0.016cm^3 volume. There is Fat Layer (Subcutaneous Tissue) exposed. There is no tunneling or undermining noted. There is a medium amount of serosanguineous drainage noted. There is medium (34-66%) red, pink granulation within the wound bed. There is a medium (34-66%) amount of necrotic tissue within the wound bed including Adherent Slough. Assessment Active Problems ICD-10 Burn of third degree of left foot, initial encounter Non-pressure chronic ulcer of other part of left foot with fat layer exposed Plan Follow-up Appointments: Return Appointment in 1 week. Bathing/ Shower/ Hygiene: May shower; gently cleanse wound with antibacterial soap, rinse and pat dry prior to dressing wounds Anesthetic (Use 'Patient Medications' Section for Anesthetic Order Entry): Lidocaine applied to wound bed WOUND #1: - Foot Wound Laterality: Left, Lateral Cleanser: Soap and Water 3 x Per Week/30 Days Discharge Instructions: Gently cleanse wound with antibacterial soap, rinse and pat dry prior to dressing wounds Prim Dressing: Prisma 4.34 (in) 3 x Per Week/30 Days ary Discharge Instructions: Moisten w/normal saline or sterile water; Cover wound as directed. Do not remove from wound bed. Secondary Dressing: Coverlet Latex-Free Fabric Adhesive Dressings 3 x Per Week/30 Days Discharge Instructions: 1.5 x 2 1. I would recommend that we have  the patient continue with the silver collagen which I think is still doing a very good job here. 2. I am also can recommend that he should continue to monitor for any signs of infection though honestly I think he is really doing quite well. 3. He is also going to continue to use the coverlet to cover over top  of I think this is appropriate and hopefully he will be ready for discharge in the next couple of weeks. We will see patient back for reevaluation in 1 week here in the clinic. If anything worsens or changes patient will contact our office for additional recommendations. Electronic Signature(s) Signed: 07/30/2022 4:58:24 PM By: [name] PA-C Entered By: [name] on 07/30/2022 16:58:24 -------------------------------------------------------------------------------- SuperBill Details Patient Name: Date of Service: INA, POUPARD 07/30/2022 Medical Record Number: [id number] Patient Account Number: [id number] Date of Birth/Sex: Treating RN: [birth year] (27 y.o. [name]) [name] Primary Care Provider: PA [name], NO Other Clinician: Referring Provider: Treating Provider/Extender: [name] in Treatment: 2 Diagnosis Coding ICD-10 Codes Code Description T25.322A Burn of third degree of left foot, initial encounter L97.522 Non-pressure chronic ulcer of other part of left foot with fat layer exposed Facility Procedures : Devan, CPT4 Code: [id number] Yoshi ([id number]) Description: 30865 - WOUND CARE VISIT-LEV 2 EST PT [id number] Modifier: [id number] P Quantity: 1 age 50 of 6 Physician Procedures : CPT4 Code Description Modifier [phone] - WC PHYS LEVEL 3 - EST PT ICD-10 Diagnosis Description T25.322A Burn of third degree of left foot, initial encounter L97.522 Non-pressure chronic ulcer of other part of left foot with fat layer exposed Quantity: 1 Electronic Signature(s) Signed: 07/30/2022 4:58:34 PM By: [name] PA-C Previous Signature:  07/30/2022 4:05:57 PM Version By: [name] RN Entered By: [name] on 07/30/2022 16:58:34

## 2022-07-31 NOTE — Progress Notes (Signed)
[name] ([id number]) [id number] Page 1 of 7 Visit Report for 07/30/2022 Arrival Information Details Patient Name: Date of Service: [name], [name] 07/30/2022 2:30 PM Medical Record Number: [id number] Patient Account Number: [id number] Date of Birth/Sex: Treating Bell: July 13, 1995 (27 y.o. [name]) [name] Primary Care [name]: PA [name], NO Other Clinician: Referring [name]: Treating [name]/Extender: [name] in Treatment: 2 Visit Information History Since Last Visit Added or deleted any medications: No Patient Arrived: Ambulatory Any new allergies or adverse reactions: No Arrival Time: 14:41 Had a fall or experienced change in No Accompanied By: self activities of daily living that may affect Transfer Assistance: None risk of falls: Patient Identification Verified: Yes Signs or symptoms of abuse/neglect since last visito No Secondary Verification Process Completed: Yes Hospitalized since last visit: No Patient Requires Transmission-Based Precautions: No Implantable device outside of the clinic excluding No Patient Has Alerts: No cellular tissue based products placed in the center since last visit: Has Dressing in Place as Prescribed: Yes Pain Present Now: No Electronic Signature(s) Signed: 07/30/2022 4:05:57 PM By: [name] Entered By: [name] on 07/30/2022 14:41:28 -------------------------------------------------------------------------------- Clinic Level of Care Assessment Details Patient Name: Date of Service: [name], [name] 07/30/2022 2:30 PM Medical Record Number: [id number] Patient Account Number: [id number] Date of Birth/Sex: Treating Bell: 1995/11/11 (27 y.o. [name]) [name] Primary Care [name]: PA [name], NO Other Clinician: Referring [name]: Treating [name]/Extender: [name] in Treatment: 2 Clinic Level of Care Assessment Items TOOL 4 Quantity Score X- 1 0 Use when  only an EandM is performed on FOLLOW-UP visit ASSESSMENTS - Nursing Assessment / Reassessment X- 1 10 Reassessment of Co-morbidities (includes updates in patient status) X- 1 5 Reassessment of Adherence to Treatment Plan ASSESSMENTS - Wound and Skin A ssessment / Reassessment X - Simple Wound Assessment / Reassessment - one wound 1 5  - 0 Complex Wound Assessment / Reassessment - multiple wounds  - 0 Dermatologic / Skin Assessment (not related to wound area) ASSESSMENTS - Focused Assessment  - 0 Circumferential Edema Measurements - multi extremities  - 0 Nutritional Assessment / Counseling / Intervention  - 0 Lower Extremity Assessment (monofilament, tuning fork, pulses)  - 0 Peripheral Arterial Disease Assessment (using hand held doppler) ASSESSMENTS - Ostomy and/or Continence Assessment and Care  - 0 Incontinence Assessment and Management  - 0 Ostomy Care Assessment and Management (repouching, etc.) PROCESS - Coordination of Care X - Simple Patient / Family Education for ongoing care 1 15 [name] ([id number]) [id number] Page 2 of 7  - 0 Complex (extensive) Patient / Family Education for ongoing care  - 0 Staff obtains Chiropractor, Records, T Results / Process Orders est  - 0 Staff telephones HHA, Nursing Homes / Clarify orders / etc  - 0 Routine Transfer to another Facility (non-emergent condition)  - 0 Routine Hospital Admission (non-emergent condition)  - 0 New Admissions / Manufacturing engineer / Ordering NPWT Apligraf, etc. ,  - 0 Emergency Hospital Admission (emergent condition)  - 0 Simple Discharge Coordination  - 0 Complex (extensive) Discharge Coordination PROCESS - Special Needs  - 0 Pediatric / Minor Patient Management  - 0 Isolation Patient Management  - 0 Hearing / Language / Visual special needs  - 0 Assessment of Community assistance (transportation, D/C planning,  etc.)  - 0 Additional assistance / Altered mentation  - 0 Support Surface(s) Assessment (bed, cushion, seat, etc.) INTERVENTIONS - Wound Cleansing / Measurement X - Simple Wound Cleansing - one wound  1 5  - 0 Complex Wound Cleansing - multiple wounds X- 1 5 Wound Imaging (photographs - any number of wounds)  - 0 Wound Tracing (instead of photographs) X- 1 5 Simple Wound Measurement - one wound  - 0 Complex Wound Measurement - multiple wounds INTERVENTIONS - Wound Dressings X - Small Wound Dressing one or multiple wounds 1 10  - 0 Medium Wound Dressing one or multiple wounds  - 0 Large Wound Dressing one or multiple wounds  - 0 Application of Medications - topical  - 0 Application of Medications - injection INTERVENTIONS - Miscellaneous  - 0 External ear exam  - 0 Specimen Collection (cultures, biopsies, blood, body fluids, etc.)  - 0 Specimen(s) / Culture(s) sent or taken to Lab for analysis  - 0 Patient Transfer (multiple staff / Nurse, adult / Similar devices)  - 0 Simple Staple / Suture removal (25 or less)  - 0 Complex Staple / Suture removal (26 or more)  - 0 Hypo / Hyperglycemic Management (close monitor of Blood Glucose)  - 0 Ankle / Brachial Index (ABI) - do not check if billed separately X- 1 5 Vital Signs Has the patient been seen at the hospital within the last three years: Yes Total Score: 65 Level Of Care: New/Established - Level 2 Electronic Signature(s) Signed: 07/30/2022 4:05:57 PM By: [name] Entered By: [name] on 07/30/2022 14:54:02 [name], [name] ([id number]) [id number] Page 3 of 7 -------------------------------------------------------------------------------- Encounter Discharge Information Details Patient Name: Date of Service: [name], [name] 07/30/2022 2:30 PM Medical Record Number: [id number] Patient Account Number: [id number] Date of Birth/Sex: Treating Bell: 1995/09/15  (27 y.o. [name]) [name] Primary Care Zeeva Courser: PA [name], NO Other Clinician: Referring Heike Pounds: Treating Egbert Seidel/Extender: [name] in Treatment: 2 Encounter Discharge Information Items Discharge Condition: Stable Ambulatory Status: Ambulatory Discharge Destination: Home Transportation: Private Auto Accompanied By: self Schedule Follow-up Appointment: Yes Clinical Summary of Care: Electronic Signature(s) Signed: 07/30/2022 4:05:57 PM By: [name] Entered By: [name] on 07/30/2022 14:55:33 -------------------------------------------------------------------------------- Lower Extremity Assessment Details Patient Name: Date of Service: [name], [name] 07/30/2022 2:30 PM Medical Record Number: [id number] Patient Account Number: [id number] Date of Birth/Sex: Treating Bell: 1995/10/03 (27 y.o. [name] Primary Care Maura Braaten: PA [name], NO Other Clinician: Referring Corbyn Wildey: Treating Darrly Loberg/Extender: [name] in Treatment: 2 Vascular Assessment Pulses: Dorsalis Pedis Palpable: [Left:Yes] Electronic Signature(s) Signed: 07/30/2022 4:05:57 PM By: [name] Entered By: [name] on 07/30/2022 14:53:11 -------------------------------------------------------------------------------- Multi Wound Chart Details Patient Name: Date of Service: [name] 07/30/2022 2:30 PM Medical Record Number: [id number] Patient Account Number: [id number] Date of Birth/Sex: Treating Bell: [birth year] (27 y.o. [name] Primary Care Armanie Martine: PA [name], NO Other Clinician: Referring Jaymari Cromie: Treating Yeimy Brabant/Extender: [name] in Treatment: 2 Vital Signs Height(in): 69 Pulse(bpm): 84 Weight(lbs): 250 Blood Pressure(mmHg): 144/78 Body Mass Index(BMI): 36.9 Temperature(F): 98.1 Respiratory Rate(breaths/min): 18 [1:Photos:] [N/A:N/A] Left, Lateral Foot N/A N/A Wound Location: Thermal Burn  N/A N/A Wounding Event: 3rd degree Burn N/A N/A Primary Etiology: 06/09/2022 N/A N/A Date Acquired: 2 N/A N/A Weeks of Treatment: Open N/A N/A Wound Status: No N/A N/A Wound Recurrence: 0.5x0.4x0.1 N/A N/A Measurements L x W x D (cm) 0.157 N/A N/A A (cm) : rea 0.016 N/A N/A Volume (cm) : 90.50% N/A N/A % Reduction in A rea: 96.80% N/A N/A % Reduction in Volume: Full Thickness Without Exposed N/A N/A Classification: Support Structures Medium N/A N/A Exudate Amount: Serosanguineous N/A  N/A Exudate Type: red, brown N/A N/A Exudate Color: Medium (34-66%) N/A N/A Granulation Amount: Red, Pink N/A N/A Granulation Quality: Medium (34-66%) N/A N/A Necrotic Amount: Fat Layer (Subcutaneous Tissue): Yes N/A N/A Exposed Structures: Fascia: No Tendon: No Muscle: No Joint: No Bone: No None N/A N/A Epithelialization: Treatment Notes Electronic Signature(s) Signed: 07/30/2022 4:05:57 PM By: [name] Entered By: [name] on 07/30/2022 14:53:15 -------------------------------------------------------------------------------- Multi-Disciplinary Care Plan Details Patient Name: Date of Service: [name] 07/30/2022 2:30 PM Medical Record Number: [id number] Patient Account Number: [id number] Date of Birth/Sex: Treating Bell: 27-Dec-1995 (27 y.o. [name]) [name] Primary Care Perkins Molina: PA [name], NO Other Clinician: Referring Dijuan Sleeth: Treating Taila Basinski/Extender: [name] in Treatment: 2 Active Inactive Necrotic Tissue Nursing Diagnoses: Knowledge deficit related to management of necrotic/devitalized tissue Goals: Patient/caregiver will verbalize understanding of reason and process for debridement of necrotic tissue Date Initiated: 07/16/2022 Target Resolution Date: 08/15/2022 Goal Status: Active Interventions: Assess patient pain level pre-, during and post procedure and prior to discharge Provide education on necrotic tissue and  debridement process Notes: Wound/Skin Impairment Nursing Diagnoses: Knowledge deficit related to ulceration/compromised skin integrity Goals: [name] ([id number]) [id number] Page 5 of 7 Patient/caregiver will verbalize understanding of skin care regimen Date Initiated: 07/16/2022 Target Resolution Date: 08/15/2022 Goal Status: Active Ulcer/skin breakdown will have a volume reduction of 30% by week 4 Date Initiated: 07/16/2022 Target Resolution Date: 08/15/2022 Goal Status: Active Ulcer/skin breakdown will have a volume reduction of 50% by week 8 Date Initiated: 07/16/2022 Target Resolution Date: 09/15/2022 Goal Status: Active Ulcer/skin breakdown will have a volume reduction of 80% by week 12 Date Initiated: 07/16/2022 Target Resolution Date: 10/15/2022 Goal Status: Active Ulcer/skin breakdown will heal within 14 weeks Date Initiated: 07/16/2022 Target Resolution Date: 11/15/2022 Goal Status: Active Interventions: Assess patient/caregiver ability to obtain necessary supplies Assess patient/caregiver ability to perform ulcer/skin care regimen upon admission and as needed Assess ulceration(s) every visit Notes: Electronic Signature(s) Signed: 07/30/2022 4:05:57 PM By: [name] Entered By: [name] on 07/30/2022 14:54:28 -------------------------------------------------------------------------------- Pain Assessment Details Patient Name: Date of Service: [name], [name] 07/30/2022 2:30 PM Medical Record Number: [id number] Patient Account Number: [id number] Date of Birth/Sex: Treating Bell: Oct 21, 1995 (27 y.o. [name] Primary Care Lahari Suttles: PA [name], NO Other Clinician: Referring Dajaun Goldring: Treating Blaire Hodsdon/Extender: [name] in Treatment: 2 Active Problems Location of Pain Severity and Description of Pain Patient Has Paino No Site Locations Pain Management and Medication Current Pain Management: Electronic  Signature(s) Signed: 07/30/2022 4:05:57 PM By: [name] Entered By: [name] on 07/30/2022 14:42:17 [name] ([id number]) [id number] Page 6 of 7 -------------------------------------------------------------------------------- Patient/Caregiver Education Details Patient Name: Date of Service: [name], [name] 4/23/2024andnbsp2:30 PM Medical Record Number: [id number] Patient Account Number: [id number] Date of Birth/Gender: Treating Bell: [birth year] (27 y.o. [name]) [name] Primary Care Physician: PA [name], [place] Other Clinician: Referring Physician: Treating Physician/Extender: [name] in Treatment: 2 Education Assessment Education Provided To: Patient Education Topics Provided Wound/Skin Impairment: Handouts: Caring for Your Ulcer Methods: Explain/Verbal Responses: State content correctly Electronic Signature(s) Signed: 07/30/2022 4:05:57 PM By: [name] Entered By: [name] on 07/30/2022 14:54:45 -------------------------------------------------------------------------------- Wound Assessment Details Patient Name: Date of Service: [name], [name] 07/30/2022 2:30 PM Medical Record Number: [id number] Patient Account Number: [id number] Date of Birth/Sex: Treating Bell: January 22, 1996 (27 y.o. [name] Primary Care Galaxy Borden: PA [name], NO Other Clinician: Referring Haylo Fake: Treating Estefana Taylor/Extender: [name] in Treatment: 2 Wound  Status Wound Number: 1 Primary Etiology: 3rd degree Burn Wound Location: Left, Lateral Foot Wound Status: Open Wounding Event: Thermal Burn Date Acquired: 06/09/2022 Weeks Of Treatment: 2 Clustered Wound: No Photos Wound Measurements Length: (cm) 0.5 Width: (cm) 0.4 Depth: (cm) 0.1 Area: (cm) 0.157 Volume: (cm) 0.016 % Reduction in Area: 90.5% % Reduction in Volume: 96.8% Epithelialization: None Tunneling: No Undermining: No Wound  Description Classification: Full Thickness Without Exposed Support Exudate Amount: Medium Exudate Type: Serosanguineous Exudate Color: red, brown Sheeler, [place] ([id number]) Structures Foul Odor After Cleansing: No Slough/Fibrino Yes [id number] Page 7 of 7 Wound Bed Granulation Amount: Medium (34-66%) Exposed Structure Granulation Quality: Red, Pink Fascia Exposed: No Necrotic Amount: Medium (34-66%) Fat Layer (Subcutaneous Tissue) Exposed: Yes Necrotic Quality: Adherent Slough Tendon Exposed: No Muscle Exposed: No Joint Exposed: No Bone Exposed: No Treatment Notes Wound #1 (Foot) Wound Laterality: Left, Lateral Cleanser Soap and Water Discharge Instruction: Gently cleanse wound with antibacterial soap, rinse and pat dry prior to dressing wounds Peri-Wound Care Topical Primary Dressing Prisma 4.34 (in) Discharge Instruction: Moisten w/normal saline or sterile water; Cover wound as directed. Do not remove from wound bed. Secondary Dressing Coverlet Latex-Free Fabric Adhesive Dressings Discharge Instruction: 1.5 x 2 Secured With Compression Wrap Compression Stockings Add-Ons Electronic Signature(s) Signed: 07/30/2022 4:05:57 PM By: [name] Entered By: [name] on 07/30/2022 14:52:55 -------------------------------------------------------------------------------- Vitals Details Patient Name: Date of Service: [name] 07/30/2022 2:30 PM Medical Record Number: [id number] Patient Account Number: [id number] Date of Birth/Sex: Treating Bell: 06/26/1995 (27 y.o. [name]) [name] Primary Care Cung Masterson: PA [name], NO Other Clinician: Referring Tyton Abdallah: Treating Bettyanne Dittman/Extender: [name] in Treatment: 2 Vital Signs Time Taken: 14:41 Temperature (F): 98.1 Height (in): 69 Pulse (bpm): 84 Weight (lbs): 250 Respiratory Rate (breaths/min): 18 Body Mass Index (BMI): 36.9 Blood Pressure (mmHg): 144/78 Reference  Range: 80 - 120 mg / dl Electronic Signature(s) Signed: 07/30/2022 4:05:57 PM By: [name] Entered By: [name] on 07/30/2022 14:42:01

## 2022-08-08 ENCOUNTER — Encounter: Payer: Self-pay | Attending: Physician Assistant | Admitting: Physician Assistant

## 2022-08-08 DIAGNOSIS — X58XXXA Exposure to other specified factors, initial encounter: Secondary | ICD-10-CM | POA: Diagnosis not present

## 2022-08-08 DIAGNOSIS — L97522 Non-pressure chronic ulcer of other part of left foot with fat layer exposed: Secondary | ICD-10-CM | POA: Insufficient documentation

## 2022-08-08 DIAGNOSIS — T25322A Burn of third degree of left foot, initial encounter: Secondary | ICD-10-CM | POA: Insufficient documentation

## 2022-08-09 NOTE — Progress Notes (Signed)
[name] ([id number]) [id number] Page 1 of 5 Visit Report for 08/08/2022 Chief Complaint Document Details Patient Name: Date of Service: [name], [name] 08/08/2022 2:30 PM Medical Record Number: [id number] Patient Account Number: [id number] Date of Birth/Sex: Treating RN: [birth year] (27 y.o. [name]) [name] Primary Care Provider: PA [name], NO Other Clinician: Referring Provider: Treating Provider/Extender: [name] in Treatment: 3 Information Obtained from: Patient Chief Complaint Left foot thermal burn Electronic Signature(s) Signed: 08/08/2022 2:15:14 PM By: [name] PA-C Entered By: [name] on 08/08/2022 14:15:13 -------------------------------------------------------------------------------- HPI Details Patient Name: Date of Service: [name], [name] 08/08/2022 2:30 PM Medical Record Number: [id number] Patient Account Number: [id number] Date of Birth/Sex: Treating RN: 05-Apr-1996 (27 y.o. [name] Primary Care Provider: PA [name], NO Other Clinician: Referring Provider: Treating Provider/Extender: [name] in Treatment: 3 History of Present Illness HPI Description: 07-16-2022 upon evaluation today patient presents for evaluation in the clinic as a result of an injury that occurred on 06-09-2022. He tells me that he actually works on an [employer] when he slipped and his foot actually got caught in a contraction/machine that caused what appears to be a significant friction burn to the dorsal surface of his foot. Initially they were not sure that he had not broken anything and actually he was sent to [facility] in order to get this checked out. Subsequently ended up not have anything broken which is great news and I am pleased in that regard. With that being said unfortunately he did have issues here with what appears to be a significant burn and while the majority this was more of a stage  II I think the worst part of this is a stage III friction burn which again is thermal in nature to the top of his foot. There is slough and biofilm buildup. This is getting to be cleared away. The good news is the patient really does not have any major medical problems. He is in pretty good shape otherwise which is great news. 07-23-2022 upon evaluation today patient's wound is actually showed signs of excellent improvement and actually very pleased with where we stand. I do not see any evidence of infection and overall we are making really good progress here. 07-30-2022 upon evaluation today patient appears to be doing well currently in regard to his wound in fact this is measuring significantly smaller I think that he is doing excellent as far as healing is concerned I think it is probably very close to complete resolution. I am figuring within a couple of weeks he will be done. 08-08-2022 upon evaluation today patient appears to be doing well currently in regard to his wound. He is tolerating the dressing changes without complication. Fortunately he is not looking like there is any signs of infection unfortunately he is having some issues here with the dressing sticking and I think this is because the lower portion opened up. Electronic Signature(s) Signed: 08/08/2022 2:30:12 PM By: [name] PA-C Entered By: [name] on 08/08/2022 14:30:11 -------------------------------------------------------------------------------- Physical Exam Details Patient Name: Date of Service: [name], [name] 08/08/2022 2:30 PM Medical Record Number: [id number] Patient Account Number: [id number] Date of Birth/Sex: Treating RN: 1995/12/05 (27 y.o. [name] Primary Care Provider: PA [name], [place] Other Clinician: Referring Provider: Treating Provider/Extender: [name] in Treatment: 3 [name] ([id number]) [id number] Page 2 of  5 Well-nourished and well-hydrated in no acute distress. Respiratory normal breathing without difficulty. Psychiatric this  patient is able to make decisions and demonstrates good insight into disease process. Alert and Oriented x 3. pleasant and cooperative. Notes Upon inspection patient's wound bed actually showed signs of good granulation epithelization at this point. Fortunately there does not appear to be any signs of infection and again I think we may want to switch over to a silver alginate dressing specifically silver cell to try to keep this from sticking and he is in agreement with this plan. Electronic Signature(s) Signed: 08/08/2022 2:30:37 PM By: [name] PA-C Entered By: [name] on 08/08/2022 14:30:36 -------------------------------------------------------------------------------- Physician Orders Details Patient Name: Date of Service: [name], [name] 08/08/2022 2:30 PM Medical Record Number: [id number] Patient Account Number: [id number] Date of Birth/Sex: Treating RN: 1995-07-19 (27 y.o. [name]) [name] Primary Care Provider: PA [name], NO Other Clinician: Referring Provider: Treating Provider/Extender: [name] in Treatment: 3 Verbal / Phone Orders: No Diagnosis Coding ICD-10 Coding Code Description T25.322A Burn of third degree of left foot, initial encounter L97.522 Non-pressure chronic ulcer of other part of left foot with fat layer exposed Follow-up Appointments Return Appointment in 1 week. Bathing/ Shower/ Hygiene May shower; gently cleanse wound with antibacterial soap, rinse and pat dry prior to dressing wounds Anesthetic (Use 'Patient Medications' Section for Anesthetic Order Entry) Lidocaine applied to wound bed Wound Treatment Wound #1 - Foot Wound Laterality: Left, Lateral Cleanser: Soap and Water 3 x Per Week/30 Days Discharge Instructions: Gently cleanse wound with antibacterial soap, rinse and pat dry prior to dressing  wounds Peri-Wound Care: AandD Ointment 3 x Per Week/30 Days Discharge Instructions: Apply AandD Ointment as directed Prim Dressing: Silvercel Small 2x2 (in/in) 3 x Per Week/30 Days ary Discharge Instructions: Apply Silvercel Small 2x2 (in/in) as instructed Secondary Dressing: Coverlet Latex-Free Fabric Adhesive Dressings 3 x Per Week/30 Days Discharge Instructions: 1.5 x 2 Electronic Signature(s) Signed: 08/08/2022 4:17:31 PM By: [name] PA-C Entered By: [name] on 08/08/2022 14:28:00 -------------------------------------------------------------------------------- Problem List Details Patient Name: Date of Service: [name] 08/08/2022 2:30 PM Medical Record Number: [id number] Patient Account Number: [id number] Date of Birth/Sex: Treating RN: [birth year] ([address] y.o. [name] Gordo, [name] ([id number]) [id number] Page 3 of 5 Primary Care Provider: PA TIENT, [place] Other Clinician: Referring Provider: Treating Provider/Extender: [name] in Treatment: 3 Active Problems ICD-10 Encounter Code Description Active Date MDM Diagnosis T25.322A Burn of third degree of left foot, initial encounter 07/16/2022 No Yes L97.522 Non-pressure chronic ulcer of other part of left foot with fat layer exposed 07/16/2022 No Yes Inactive Problems Resolved Problems Electronic Signature(s) Signed: 08/08/2022 2:14:17 PM By: [name] PA-C Entered By: [name] on 08/08/2022 14:14:17 -------------------------------------------------------------------------------- Progress Note Details Patient Name: Date of Service: [name], [name] 08/08/2022 2:30 PM Medical Record Number: [id number] Patient Account Number: [id number] Date of Birth/Sex: Treating RN: 09/20/1995 (27 y.o. [name] Primary Care Provider: PA [name], NO Other Clinician: Referring Provider: Treating Provider/Extender: [name] in Treatment:  3 Subjective Chief Complaint Information obtained from Patient Left foot thermal burn History of Present Illness (HPI) 07-16-2022 upon evaluation today patient presents for evaluation in the clinic as a result of an injury that occurred on 06-09-2022. He tells me that he actually works on an [employer] when he slipped and his foot actually got caught in a contraction/machine that caused what appears to be a significant friction burn to the dorsal surface of his foot. Initially they were not sure that he had not broken  anything and actually he was sent to [facility] in order to get this checked out. Subsequently ended up not have anything broken which is great news and I am pleased in that regard. With that being said unfortunately he did have issues here with what appears to be a significant burn and while the majority this was more of a stage II I think the worst part of this is a stage III friction burn which again is thermal in nature to the top of his foot. There is slough and biofilm buildup. This is getting to be cleared away. The good news is the patient really does not have any major medical problems. He is in pretty good shape otherwise which is great news. 07-23-2022 upon evaluation today patient's wound is actually showed signs of excellent improvement and actually very pleased with where we stand. I do not see any evidence of infection and overall we are making really good progress here. 07-30-2022 upon evaluation today patient appears to be doing well currently in regard to his wound in fact this is measuring significantly smaller I think that he is doing excellent as far as healing is concerned I think it is probably very close to complete resolution. I am figuring within a couple of weeks he will be done. 08-08-2022 upon evaluation today patient appears to be doing well currently in regard to his wound. He is tolerating the dressing changes without complication. Fortunately  he is not looking like there is any signs of infection unfortunately he is having some issues here with the dressing sticking and I think this is because the lower portion opened up. Objective Constitutional Well-nourished and well-hydrated in no acute distress. Vitals Time Taken: 2:10 PM, Height: 69 in, Weight: 250 lbs, BMI: 36.9, Temperature: 98.2 F, Pulse: 69 bpm, Respiratory Rate: 18 breaths/min, Blood Pressure: [name], [name] ([id number]) [id number] Page 4 of 5 148/88 mmHg. Respiratory normal breathing without difficulty. Psychiatric this patient is able to make decisions and demonstrates good insight into disease process. Alert and Oriented x 3. pleasant and cooperative. General Notes: Upon inspection patient's wound bed actually showed signs of good granulation epithelization at this point. Fortunately there does not appear to be any signs of infection and again I think we may want to switch over to a silver alginate dressing specifically silver cell to try to keep this from sticking and he is in agreement with this plan. Integumentary (Hair, Skin) Wound #1 status is Open. Original cause of wound was Thermal Burn. The date acquired was: 06/09/2022. The wound has been in treatment 3 weeks. The wound is located on the Left,Lateral Foot. The wound measures 2cm length x 0.4cm width x 0.1cm depth; 0.628cm^2 area and 0.063cm^3 volume. There is Fat Layer (Subcutaneous Tissue) exposed. There is no tunneling or undermining noted. There is a medium amount of serosanguineous drainage noted. There is medium (34-66%) red, pink granulation within the wound bed. There is a medium (34-66%) amount of necrotic tissue within the wound bed including Adherent Slough. Assessment Active Problems ICD-10 Burn of third degree of left foot, initial encounter Non-pressure chronic ulcer of other part of left foot with fat layer exposed Plan Follow-up Appointments: Return Appointment  in 1 week. Bathing/ Shower/ Hygiene: May shower; gently cleanse wound with antibacterial soap, rinse and pat dry prior to dressing wounds Anesthetic (Use 'Patient Medications' Section for Anesthetic Order Entry): Lidocaine applied to wound bed WOUND #1: - Foot Wound Laterality: Left, Lateral Cleanser: Soap and Water 3 x Per Week/30  Days Discharge Instructions: Gently cleanse wound with antibacterial soap, rinse and pat dry prior to dressing wounds Peri-Wound Care: AandD Ointment 3 x Per Week/30 Days Discharge Instructions: Apply AandD Ointment as directed Prim Dressing: Silvercel Small 2x2 (in/in) 3 x Per Week/30 Days ary Discharge Instructions: Apply Silvercel Small 2x2 (in/in) as instructed Secondary Dressing: Coverlet Latex-Free Fabric Adhesive Dressings 3 x Per Week/30 Days Discharge Instructions: 1.5 x 2 1. Would recommend that we have the patient continue to monitor for any signs of infection or worsening if anything changes he knows contact the office let me know. 2 also can recommend the patient should continue to keep this covered would recommend a little bit of AandE around the edges of the wound silver cell top and then a Band-Aid to secure. We will see patient back for reevaluation in 1 week here in the clinic. If anything worsens or changes patient will contact our office for additional recommendations. Electronic Signature(s) Signed: 08/08/2022 2:31:02 PM By: [name] PA-C Entered By: [name] on 08/08/2022 14:31:02 -------------------------------------------------------------------------------- SuperBill Details Patient Name: Date of Service: [name], [name] 08/08/2022 Medical Record Number: [id number] Patient Account Number: [id number] Date of Birth/Sex: Treating RN: 1996-03-21 (27 y.o. [name] Primary Care Provider: PA [name], [place] Other Clinician: Referring Provider: Treating Provider/Extender: [name] in Treatment: 3 Diagnosis  Coding [name] ([id number]) [id number] Page 5 of 5 ICD-10 Codes Code Description T25.322A Burn of third degree of left foot, initial encounter L97.522 Non-pressure chronic ulcer of other part of left foot with fat layer exposed Facility Procedures : CPT4 Code: 08657846 Description: [phone] - WOUND CARE VISIT-LEV 2 EST PT Modifier: Quantity: 1 Physician Procedures : CPT4 Code Description Modifier [id number] - WC PHYS LEVEL 3 - EST PT ICD-10 Diagnosis Description T25.322A Burn of third degree of left foot, initial encounter L97.522 Non-pressure chronic ulcer of other part of left foot with fat layer exposed Quantity: 1 Electronic Signature(s) Signed: 08/08/2022 4:17:31 PM By: [name] PA-C Previous Signature: 08/08/2022 2:31:25 PM Version By: [name] PA-C Entered By: [name] on 08/08/2022 14:32:34

## 2022-08-15 ENCOUNTER — Encounter: Payer: Self-pay | Admitting: Physician Assistant

## 2022-08-15 DIAGNOSIS — L97522 Non-pressure chronic ulcer of other part of left foot with fat layer exposed: Secondary | ICD-10-CM | POA: Diagnosis not present

## 2022-08-15 NOTE — Progress Notes (Addendum)
BRYCEN, SALSEDO ([id number]) [id number] Page 1 of 5 Visit Report for 08/15/2022 Chief Complaint Document Details Patient Name: Date of Service: [name], [name] 08/15/2022 2:00 PM Medical Record Number: [id number] Patient Account Number: [id number] Date of Birth/Sex: Treating RN: 01/25/1996 (27 y.o. Laymond Purser Primary Care Provider: PA [name], [place] Other Clinician: Referring Provider: Treating Provider/Extender: [name] in Treatment: 4 Information Obtained from: Patient Chief Complaint Left foot thermal burn Electronic Signature(s) Signed: 08/15/2022 2:13:32 PM By: [name] PA-C Entered By: [name] on 08/15/2022 14:13:31 -------------------------------------------------------------------------------- HPI Details Patient Name: Date of Service: [name] [name] 08/15/2022 2:00 PM Medical Record Number: [id number] Patient Account Number: [id number] Date of Birth/Sex: Treating RN: 1995-06-12 (27 y.o. Laymond Purser Primary Care Provider: PA [name], [place] Other Clinician: Referring Provider: Treating Provider/Extender: [name] in Treatment: 4 History of Present Illness HPI Description: 07-16-2022 upon evaluation today patient presents for evaluation in the clinic as a result of an injury that occurred on 06-09-2022. He tells me that he actually works on an [employer] when he slipped and his foot actually got caught in a contraction/machine that caused what appears to be a significant friction burn to the dorsal surface of his foot. Initially they were not sure that he had not broken anything and actually he was sent to [facility] in order to get this checked out. Subsequently ended up not have anything broken which is great news and I am pleased in that regard. With that being said unfortunately he did have issues here with what appears to be a significant burn and while the majority this was more of a  stage II I think the worst part of this is a stage III friction burn which again is thermal in nature to the top of his foot. There is slough and biofilm buildup. This is getting to be cleared away. The good news is the patient really does not have any major medical problems. He is in pretty good shape otherwise which is great news. 07-23-2022 upon evaluation today patient's wound is actually showed signs of excellent improvement and actually very pleased with where we stand. I do not see any evidence of infection and overall we are making really good progress here. 07-30-2022 upon evaluation today patient appears to be doing well currently in regard to his wound in fact this is measuring significantly smaller I think that he is doing excellent as far as healing is concerned I think it is probably very close to complete resolution. I am figuring within a couple of weeks he will be done. 08-08-2022 upon evaluation today patient appears to be doing well currently in regard to his wound. He is tolerating the dressing changes without complication. Fortunately he is not looking like there is any signs of infection unfortunately he is having some issues here with the dressing sticking and I think this is because the lower portion opened up. 08-15-2022 upon evaluation today patient's wound actually appears to be doing great in fact I think he is completely healed. Fortunately I do not see any signs of active infection locally nor systemically which is great news and I am very pleased in that regard. I do not see any signs of an open wound which is also great news. In general I do believe that we are making progress here. And the patient in fact appears to be completely healed. Electronic Signature(s) Signed: 08/15/2022 2:31:34 PM By: [name] PA-C Entered By: [name]  on 08/15/2022 14:31:34 -------------------------------------------------------------------------------- Physical Exam Details Patient Name:  Date of Service: [name], [name] 08/15/2022 2:00 PM Medical Record Number: [id number] Patient Account Number: [id number] Date of Birth/Sex: Treating RN: 04/22/1995 (27 y.o. Laymond Purser Primary Care Provider: PA [name], [place] Other Clinician: Referring Provider: Treating Provider/Extender: Sosaia, Beales, [name] ([id number]) [id number] Page 2 of 5 Weeks in Treatment: 4 Constitutional Well-nourished and well-hydrated in no acute distress. Respiratory normal breathing without difficulty. Psychiatric this patient is able to make decisions and demonstrates good insight into disease process. Alert and Oriented x 3. pleasant and cooperative. Notes Upon inspection patient's wound bed actually showed signs of again complete epithelization there is still going to be some need for protection over the next 2 weeks but to be honest I think he is pretty much healed at this point. His biggest issue is that of having trouble with the Achilles being tight and painful with walking. I think he really needs to see orthopedics in order to have this cleared they may need to do some physical therapy to get him back to his normal state and ready to get back to work. I know he is ready to get back. From a wound care perspective he is probably fine from the Achilles perspective I think that is a different story. Electronic Signature(s) Signed: 08/15/2022 2:33:39 PM By: [name] PA-C Previous Signature: 08/15/2022 2:32:08 PM Version By: [name] PA-C Entered By: [name] on 08/15/2022 14:33:39 -------------------------------------------------------------------------------- Physician Orders Details Patient Name: Date of Service: [name], [name] [name] 08/15/2022 2:00 PM Medical Record Number: [id number] Patient Account Number: [id number] Date of Birth/Sex: Treating RN: 1995-11-09 (27 y.o. Laymond Purser Primary Care Provider: PA [name], [place] Other Clinician: Referring  Provider: Treating Provider/Extender: [name] in Treatment: 4 Verbal / Phone Orders: No Diagnosis Coding ICD-10 Coding Code Description T25.322A Burn of third degree of left foot, initial encounter L97.522 Non-pressure chronic ulcer of other part of left foot with fat layer exposed Discharge From [facility] Services Discharge from Wound Care Center Treatment Complete - KEEP PROTECTIVE DRESSING ON HEALED WOUND TO ALLOW NEW SKIN TO TOUGHEN UP. YOU CAN DO THIS FOR 2 WEEKS. PLEASE CALL WOUND CLINIC WITH ANY ISSUES TO HEALED WOUND. Electronic Signature(s) Signed: 08/15/2022 2:50:43 PM By: Angelina Pih Signed: 08/15/2022 4:04:13 PM By: [name] PA-C Entered By: Angelina Pih on 08/15/2022 14:50:43 -------------------------------------------------------------------------------- Problem List Details Patient Name: Date of Service: [name] [name] 08/15/2022 2:00 PM Medical Record Number: [id number] Patient Account Number: [id number] Date of Birth/Sex: Treating RN: 08-19-1995 (27 y.o. Laymond Purser Primary Care Provider: PA [name], [place] Other Clinician: Referring Provider: Treating Provider/Extender: [name] in Treatment: 4 Active Problems ICD-10 Encounter Code Description Active Date MDM Diagnosis T25.322A Burn of third degree of left foot, initial encounter 07/16/2022 No Yes [name], [name] ([id number]) [id number] Page 3 of 5 [phone] Non-pressure chronic ulcer of other part of left foot with fat layer exposed 07/16/2022 No Yes Inactive Problems Resolved Problems Electronic Signature(s) Signed: 08/15/2022 2:13:29 PM By: [name] PA-C Entered By: [name] on 08/15/2022 14:13:28 -------------------------------------------------------------------------------- Progress Note Details Patient Name: Date of Service: [name] [name] 08/15/2022 2:00 PM Medical Record Number: [id number] Patient Account Number:  [id number] Date of Birth/Sex: Treating RN: September 10, 1995 (27 y.o. Laymond Purser Primary Care Provider: PA [name], [place] Other Clinician: Referring Provider: Treating Provider/Extender: [name] in Treatment: 4 Subjective Chief Complaint Information obtained from Patient Left foot thermal burn History of  Present Illness (HPI) 07-16-2022 upon evaluation today patient presents for evaluation in the clinic as a result of an injury that occurred on 06-09-2022. He tells me that he actually works on an [employer] when he slipped and his foot actually got caught in a contraction/machine that caused what appears to be a significant friction burn to the dorsal surface of his foot. Initially they were not sure that he had not broken anything and actually he was sent to [facility] in order to get this checked out. Subsequently ended up not have anything broken which is great news and I am pleased in that regard. With that being said unfortunately he did have issues here with what appears to be a significant burn and while the majority this was more of a stage II I think the worst part of this is a stage III friction burn which again is thermal in nature to the top of his foot. There is slough and biofilm buildup. This is getting to be cleared away. The good news is the patient really does not have any major medical problems. He is in pretty good shape otherwise which is great news. 07-23-2022 upon evaluation today patient's wound is actually showed signs of excellent improvement and actually very pleased with where we stand. I do not see any evidence of infection and overall we are making really good progress here. 07-30-2022 upon evaluation today patient appears to be doing well currently in regard to his wound in fact this is measuring significantly smaller I think that he is doing excellent as far as healing is concerned I think it is probably very close to complete  resolution. I am figuring within a couple of weeks he will be done. 08-08-2022 upon evaluation today patient appears to be doing well currently in regard to his wound. He is tolerating the dressing changes without complication. Fortunately he is not looking like there is any signs of infection unfortunately he is having some issues here with the dressing sticking and I think this is because the lower portion opened up. 08-15-2022 upon evaluation today patient's wound actually appears to be doing great in fact I think he is completely healed. Fortunately I do not see any signs of active infection locally nor systemically which is great news and I am very pleased in that regard. I do not see any signs of an open wound which is also great news. In general I do believe that we are making progress here. And the patient in fact appears to be completely healed. Objective Constitutional Well-nourished and well-hydrated in no acute distress. Vitals Time Taken: 2:14 PM, Height: 69 in, Weight: 250 lbs, BMI: 36.9, Temperature: 97.5 F, Pulse: 80 bpm, Respiratory Rate: 18 breaths/min, Blood Pressure: 150/90 mmHg. Respiratory normal breathing without difficulty. Psychiatric this patient is able to make decisions and demonstrates good insight into disease process. Alert and Oriented x 3. pleasant and cooperative. General Notes: Upon inspection patient's wound bed actually showed signs of again complete epithelization there is still going to be some need for protection over the next 2 weeks but to be honest I think he is pretty much healed at this point. His biggest issue is that of having trouble with the Achilles being tight and painful with walking. I think he really needs to see orthopedics in order to have this cleared they may need to do some physical therapy to get him back to his [name], [name] ([id number]) [id number] Page 4 of 5 normal state and  ready to get back to work. I know  he is ready to get back. From a wound care perspective he is probably fine from the Achilles perspective I think that is a different story. Integumentary (Hair, Skin) Wound #1 status is Open. Original cause of wound was Thermal Burn. The date acquired was: 06/09/2022. The wound has been in treatment 4 weeks. The wound is located on the Left,Lateral Foot. The wound measures 0.1cm length x 0.1cm width x 0.1cm depth; 0.008cm^2 area and 0.001cm^3 volume. There is no tunneling or undermining noted. There is a medium amount of serosanguineous drainage noted. There is no granulation within the wound bed. There is a large (67-100%) amount of necrotic tissue within the wound bed including Adherent Slough. Assessment Active Problems ICD-10 Burn of third degree of left foot, initial encounter Non-pressure chronic ulcer of other part of left foot with fat layer exposed Plan 1. I would recommend currently that we have the patient discontinue wound care services he appears to be completely healed and from the standpoint of the wound I do not think he has any limitations as far as work is concerned. With that being said my biggest concern is that that I think she has still some continued issues with the Achilles tendon which I mention at the last visit as well I believe he likely needs to see orthopedics as quickly as possible to get this checked out he may need to do some physical therapy. 2. With regard to the Achilles I did recommend some stretching type exercises with resistance band he can also look into getting some exercises online for Achilles tendon physical therapy/rehab. 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 injury. The patient voiced understanding. If there is any questions from the adjuster they can definitely contact the office and let me know as well. We will see patient back for reevaluation in 1 week here  in the clinic. If anything worsens or changes patient will contact our office for additional recommendations. Electronic Signature(s) Signed: 08/15/2022 2:33:46 PM By: [name] PA-C Previous Signature: 08/15/2022 2:33:09 PM Version By: [name] PA-C Entered By: [name] on 08/15/2022 14:33:46 -------------------------------------------------------------------------------- SuperBill Details Patient Name: Date of Service: [name], [name] 08/15/2022 Medical Record Number: [id number] Patient Account Number: [id number] Date of Birth/Sex: Treating RN: Aug 17, 1995 (27 y.o. Laymond Purser Primary Care Provider: PA [name], [place] Other Clinician: Referring Provider: Treating Provider/Extender: [name] in Treatment: 4 Diagnosis Coding ICD-10 Codes Code Description T25.322A Burn of third degree of left foot, initial encounter L97.522 Non-pressure chronic ulcer of other part of left foot with fat layer exposed Facility Procedures : CPT4 Code: [id number] Description: 99213 - WOUND CARE VISIT-LEV 3 EST PT Modifier: Quantity: 1 Physician Procedures : CPT4 Code Description Modifier [id number] 99213 - WC PHYS LEVEL 3 - EST PT ICD-10 Diagnosis Description T25.322A Burn of third degree of left foot, initial encounter L97.522 Non-pressure chronic ulcer of other part of left foot with fat layer exposed  [name], [name] ([id number]) [id number] Page 5 of Quantity: 1 5 Electronic Signature(s) Signed: 08/15/2022 2:51:11 PM By: Angelina Pih Signed: 08/15/2022 4:04:13 PM By: [name] PA-C Previous Signature: 08/15/2022 2:33:18 PM Version By: [name] PA-C Entered By: Angelina Pih on 08/15/2022 14:51:11

## 2022-08-15 NOTE — Progress Notes (Addendum)
[name] ([id number]) [id number] Page 1 of 7 Visit Report for 08/15/2022 Arrival Information Details Patient Name: Date of Service: [name], [name] 08/15/2022 2:00 PM Medical Record Number: [id number] Patient Account Number: [id number] Date of Birth/Sex: Treating RN: [birth year] (27 y.o. [name] Primary Care Dashia Caldeira: PA [name], NO Other Clinician: Referring Crystalann Korf: Treating Jamey Demchak/Extender: [name] in Treatment: 4 Visit Information History Since Last Visit Added or deleted any medications: No Patient Arrived: Ambulatory Any new allergies or adverse reactions: No Arrival Time: 14:13 Had a fall or experienced change in No Accompanied By: self activities of daily living that may affect Transfer Assistance: None risk of falls: Patient Identification Verified: Yes Hospitalized since last visit: No Secondary Verification Process Completed: Yes Has Dressing in Place as Prescribed: Yes Patient Requires Transmission-Based Precautions: No Pain Present Now: Yes Patient Has Alerts: No Electronic Signature(s) Signed: 08/15/2022 4:04:22 PM By: Angelina Pih Entered By: Angelina Pih on 08/15/2022 14:14:03 -------------------------------------------------------------------------------- Clinic Level of Care Assessment Details Patient Name: Date of Service: Bell, Eduardo 08/15/2022 2:00 PM Medical Record Number: [id number] Patient Account Number: [id number] Date of Birth/Sex: Treating RN: 07/26/1995 (27 y.o. [name] Primary Care Annalei Friesz: PA TIENT, NO Other Clinician: Referring Taliyah Watrous: Treating Hasani Diemer/Extender: [name] in Treatment: 4 Clinic Level of Care Assessment Items TOOL 4 Quantity Score []  - 0 Use when only an EandM is performed on FOLLOW-UP visit ASSESSMENTS - Nursing Assessment / Reassessment X- 1 10 Reassessment of Co-morbidities (includes updates in patient status) X- 1  5 Reassessment of Adherence to Treatment Plan ASSESSMENTS - Wound and Skin A ssessment / Reassessment X - Simple Wound Assessment / Reassessment - one wound 1 5 []  - 0 Complex Wound Assessment / Reassessment - multiple wounds []  - 0 Dermatologic / Skin Assessment (not related to wound area) ASSESSMENTS - Focused Assessment []  - 0 Circumferential Edema Measurements - multi extremities []  - 0 Nutritional Assessment / Counseling / Intervention []  - 0 Lower Extremity Assessment (monofilament, tuning fork, pulses) []  - 0 Peripheral Arterial Disease Assessment (using hand held doppler) ASSESSMENTS - Ostomy and/or Continence Assessment and Care []  - 0 Incontinence Assessment and Management []  - 0 Ostomy Care Assessment and Management (repouching, etc.) PROCESS - Coordination of Care X - Simple Patient / Family Education for ongoing care 1 15 []  - 0 Complex (extensive) Patient / Family Education for ongoing care [name], [name] ([id number]) [id number] Page 2 of 7 X- 1 10 Staff obtains Consents, Records, T Results / Process Orders est []  - 0 Staff telephones HHA, Nursing Homes / Clarify orders / etc []  - 0 Routine Transfer to another Facility (non-emergent condition) []  - 0 Routine Hospital Admission (non-emergent condition) []  - 0 New Admissions / Manufacturing engineer / Ordering NPWT Apligraf, etc. , []  - 0 Emergency Hospital Admission (emergent condition) X- 1 10 Simple Discharge Coordination []  - 0 Complex (extensive) Discharge Coordination PROCESS - Special Needs []  - 0 Pediatric / Minor Patient Management []  - 0 Isolation Patient Management []  - 0 Hearing / Language / Visual special needs []  - 0 Assessment of Community assistance (transportation, D/C planning, etc.) []  - 0 Additional assistance / Altered mentation []  - 0 Support Surface(s) Assessment (bed, cushion, seat, etc.) INTERVENTIONS - Wound Cleansing / Measurement X - Simple  Wound Cleansing - one wound 1 5 []  - 0 Complex Wound Cleansing - multiple wounds X- 1 5 Wound Imaging (photographs - any number of wounds) []  - 0 Wound Tracing (instead of  photographs) X- 1 5 Simple Wound Measurement - one wound []  - 0 Complex Wound Measurement - multiple wounds INTERVENTIONS - Wound Dressings X - Small Wound Dressing one or multiple wounds 1 10 []  - 0 Medium Wound Dressing one or multiple wounds []  - 0 Large Wound Dressing one or multiple wounds X- 1 5 Application of Medications - topical []  - 0 Application of Medications - injection INTERVENTIONS - Miscellaneous []  - 0 External ear exam []  - 0 Specimen Collection (cultures, biopsies, blood, body fluids, etc.) []  - 0 Specimen(s) / Culture(s) sent or taken to Lab for analysis []  - 0 Patient Transfer (multiple staff / Nurse, adult / Similar devices) []  - 0 Simple Staple / Suture removal (25 or less) []  - 0 Complex Staple / Suture removal (26 or more) []  - 0 Hypo / Hyperglycemic Management (close monitor of Blood Glucose) []  - 0 Ankle / Brachial Index (ABI) - do not check if billed separately X- 1 5 Vital Signs Has the patient been seen at the hospital within the last three years: Yes Total Score: 90 Level Of Care: New/Established - Level 3 Electronic Signature(s) Signed: 08/15/2022 4:04:22 PM By: Angelina Pih Entered By: Angelina Pih on 08/15/2022 14:51:04 [name] ([id number]) [id number] Page 3 of 7 -------------------------------------------------------------------------------- Encounter Discharge Information Details Patient Name: Date of Service: Bell, Eduardo 08/15/2022 2:00 PM Medical Record Number: [id number] Patient Account Number: [id number] Date of Birth/Sex: Treating RN: [birth year] (27 y.o. [name] Primary Care Jonay Hitchcock: PA [name], NO Other Clinician: Referring Eddie Payette: Treating Cace Osorto/Extender: [name] in Treatment:  4 Encounter Discharge Information Items Discharge Condition: Stable Ambulatory Status: Ambulatory Discharge Destination: Home Transportation: Private Auto Accompanied By: self Schedule Follow-up Appointment: No Clinical Summary of Care: Electronic Signature(s) Signed: 08/15/2022 2:52:06 PM By: Angelina Pih Entered By: Angelina Pih on 08/15/2022 14:52:06 -------------------------------------------------------------------------------- Lower Extremity Assessment Details Patient Name: Date of Service: ROMELLE, GLANTON 08/15/2022 2:00 PM Medical Record Number: [id number] Patient Account Number: [id number] Date of Birth/Sex: Treating RN: 1995/05/30 (27 y.o. [name] Primary Care Merilyn Pagan: PA [name], NO Other Clinician: Referring Porfiria Heinrich: Treating Shellby Schlink/Extender: [name] in Treatment: 4 Edema Assessment Assessed: [Left: No] [Right: No] [Left: Edema] [Right: :] Calf Left: Right: Point of Measurement: 39 cm From Medial Instep 40.2 cm Ankle Left: Right: Point of Measurement: 10 cm From Medial Instep 22 cm Vascular Assessment Pulses: Dorsalis Pedis Palpable: [Left:Yes] Posterior Tibial Palpable: [Left:Yes] Electronic Signature(s) Signed: 08/15/2022 4:04:22 PM By: Angelina Pih Entered By: Angelina Pih on 08/15/2022 14:22:34 -------------------------------------------------------------------------------- Multi Wound Chart Details Patient Name: Date of Service: [name] 08/15/2022 2:00 PM Medical Record Number: [id number] Patient Account Number: [id number] Date of Birth/Sex: Treating RN: 14-Mar-1996 (27 y.o. [name] Primary Care Randy Whitener: PA [name], [place] Other Clinician: Referring Jacquelin Krajewski: Treating Merrick Maggio/Extender: [name] in Treatment: 4 Butler, [name] ([id number]) [id number] Page 4 of 7 Vital Signs Height(in): 69 Pulse(bpm): 80 Weight(lbs): 250 Blood Pressure(mmHg):  150/90 Body Mass Index(BMI): 36.9 Temperature(F): 97.5 Respiratory Rate(breaths/min): 18 [1:Photos:] [N/A:N/A] Left, Lateral Foot N/A N/A Wound Location: Thermal Burn N/A N/A Wounding Event: 3rd degree Burn N/A N/A Primary Etiology: 06/09/2022 N/A N/A Date Acquired: 4 N/A N/A Weeks of Treatment: Healed - Epithelialized N/A N/A Wound Status: No N/A N/A Wound Recurrence: 0x0x0 N/A N/A Measurements L x W x D (cm) 0 N/A N/A A (cm) : rea 0 N/A N/A Volume (cm) : 100.00% N/A N/A % Reduction in A rea: 100.00% N/A N/A %  Reduction in Volume: Full Thickness Without Exposed N/A N/A Classification: Support Structures None Present N/A N/A Exudate Amount: None Present (0%) N/A N/A Granulation Amount: None Present (0%) N/A N/A Necrotic Amount: Fascia: No N/A N/A Exposed Structures: Fat Layer (Subcutaneous Tissue): No Tendon: No Muscle: No Joint: No Bone: No Large (67-100%) N/A N/A Epithelialization: Treatment Notes Electronic Signature(s) Signed: 08/15/2022 2:50:29 PM By: Angelina Pih Previous Signature: 08/15/2022 2:49:53 PM Version By: Angelina Pih Entered By: Angelina Pih on 08/15/2022 14:50:29 -------------------------------------------------------------------------------- Multi-Disciplinary Care Plan Details Patient Name: Date of Service: [name] 08/15/2022 2:00 PM Medical Record Number: [id number] Patient Account Number: [id number] Date of Birth/Sex: Treating RN: 11-01-1995 (27 y.o. [name] Primary Care Aleeya Veitch: PA [name], [place] Other Clinician: Referring Aashka Salomone: Treating Merida Alcantar/Extender: [name] in Treatment: 4 Active Inactive Electronic Signature(s) Signed: 08/15/2022 2:51:26 PM By: Angelina Pih Entered By: Angelina Pih on 08/15/2022 14:51:26 Pain Assessment Details -------------------------------------------------------------------------------- [name] ([id number])  [id number] Page 5 of 7 Patient Name: Date of Service: LUIAN, NERISON 08/15/2022 2:00 PM Medical Record Number: [id number] Patient Account Number: [id number] Date of Birth/Sex: Treating RN: 04-01-1996 (27 y.o. [name] Primary Care Jordann Grime: PA [name], NO Other Clinician: Referring Charletha Dalpe: Treating Jojo Geving/Extender: [name] in Treatment: 4 Active Problems Location of Pain Severity and Description of Pain Patient Has Paino Yes Site Locations Rate the pain. Current Pain Level: 5 Pain Management and Medication Current Pain Management: Electronic Signature(s) Signed: 08/15/2022 4:04:22 PM By: Angelina Pih Entered By: Angelina Pih on 08/15/2022 14:21:38 -------------------------------------------------------------------------------- Patient/Caregiver Education Details Patient Name: Date of Service: Rosary Lively 5/9/2024andnbsp2:00 PM Medical Record Number: [id number] Patient Account Number: [id number] Date of Birth/Gender: Treating RN: 08/20/1995 (27 y.o. [name] Primary Care Physician: PA [name], [place] Other Clinician: Referring Physician: Treating Physician/Extender: [name] in Treatment: 4 Education Assessment Education Provided To: Patient Education Topics Provided Wound/Skin Impairment: Handouts: Other: care of healed wound Methods: Explain/Verbal Responses: State content correctly Electronic Signature(s) Signed: 08/15/2022 4:04:22 PM By: Angelina Pih Entered By: Angelina Pih on 08/15/2022 14:51:41 Wound Assessment Details -------------------------------------------------------------------------------- [name] ([id number]) [id number] Page 6 of 7 Patient Name: Date of Service: FORRESTER, COTRELL 08/15/2022 2:00 PM Medical Record Number: [id number] Patient Account Number: [id number] Date of Birth/Sex: Treating RN: 12-17-1995 (27 y.o. [name] Primary Care Xaviar Lunn: PA [name], NO Other Clinician: Referring Nichol Ator: Treating Kelsye Loomer/Extender: [name] in Treatment: 4 Wound Status Wound Number: 1 Primary Etiology: 3rd degree Burn Wound Location: Left, Lateral Foot Wound Status: Healed - Epithelialized Wounding Event: Thermal Burn Date Acquired: 06/09/2022 Weeks Of Treatment: 4 Clustered Wound: No Photos Wound Measurements Length: (cm) 0 % Reduction in Area: 100% Width: (cm) 0 % Reduction in Volume: 100% Depth: (cm) 0 Epithelialization: Large (67-100%) Area: (cm) 0 Tunneling: No Volume: (cm) 0 Undermining: No Wound Description Classification: Full Thickness Without Exposed Support Structures Foul Odor After Cleansing: No Exudate Amount: None Present Slough/Fibrino No Wound Bed Granulation Amount: None Present (0%) Exposed Structure Necrotic Amount: None Present (0%) Fascia Exposed: No Fat Layer (Subcutaneous Tissue) Exposed: No Tendon Exposed: No Muscle Exposed: No Joint Exposed: No Bone Exposed: No Electronic Signature(s) Signed: 08/15/2022 2:50:20 PM By: Angelina Pih Entered By: Angelina Pih on 08/15/2022 14:50:19 -------------------------------------------------------------------------------- Vitals Details Patient Name: Date of Service: [name] 08/15/2022 2:00 PM Medical Record Number: [id number] Patient Account Number: [id number] Date of Birth/Sex: Treating RN: Oct 01, 1995 (27 y.o. [name] Primary Care Mark Benecke: PA TIENT, NO Other  Clinician: Referring Latavious Bitter: Treating Jet Traynham/Extender: [name] in Treatment: 4 Vital Signs Time Taken: 14:14 Temperature (F): 97.5 Height (in): 69 Pulse (bpm): 80 Weight (lbs): 250 Respiratory Rate (breaths/min): 18 Body Mass Index (BMI): 36.9 Blood Pressure (mmHg): 150/90 Reference Range: 80 - 120 mg / dl Syverson, Pesach ([id number]) [id number] Page 7 of  7 Electronic Signature(s) Signed: 08/15/2022 4:04:22 PM By: Angelina Pih Entered By: Angelina Pih on 08/15/2022 14:17:09
